# Patient Record
Sex: Female | Born: 1955 | Race: Black or African American | Hispanic: No | Marital: Married | Smoking: Never smoker
Health system: Southern US, Community
[De-identification: ages and names within clinical notes are randomized; demographics above are authoritative.]

## PROBLEM LIST (undated history)

## (undated) DIAGNOSIS — I10 Essential (primary) hypertension: Secondary | ICD-10-CM

## (undated) DIAGNOSIS — R7303 Prediabetes: Secondary | ICD-10-CM

## (undated) DIAGNOSIS — M2550 Pain in unspecified joint: Secondary | ICD-10-CM

## (undated) DIAGNOSIS — M549 Dorsalgia, unspecified: Secondary | ICD-10-CM

## (undated) DIAGNOSIS — E785 Hyperlipidemia, unspecified: Secondary | ICD-10-CM

## (undated) DIAGNOSIS — J45909 Unspecified asthma, uncomplicated: Secondary | ICD-10-CM

## (undated) DIAGNOSIS — M25569 Pain in unspecified knee: Secondary | ICD-10-CM

## (undated) DIAGNOSIS — M25559 Pain in unspecified hip: Secondary | ICD-10-CM

## (undated) DIAGNOSIS — G629 Polyneuropathy, unspecified: Secondary | ICD-10-CM

## (undated) DIAGNOSIS — E119 Type 2 diabetes mellitus without complications: Secondary | ICD-10-CM

## (undated) HISTORY — DX: Essential (primary) hypertension: I10

## (undated) HISTORY — DX: Type 2 diabetes mellitus without complications: E11.9

## (undated) HISTORY — PX: OTHER SURGICAL HISTORY: SHX169

## (undated) HISTORY — DX: Pain in unspecified knee: M25.569

## (undated) HISTORY — DX: Pain in unspecified joint: M25.50

## (undated) HISTORY — DX: Unspecified asthma, uncomplicated: J45.909

## (undated) HISTORY — DX: Polyneuropathy, unspecified: G62.9

## (undated) HISTORY — DX: Pain in unspecified hip: M25.559

## (undated) HISTORY — DX: Dorsalgia, unspecified: M54.9

## (undated) HISTORY — DX: Prediabetes: R73.03

## (undated) HISTORY — DX: Hyperlipidemia, unspecified: E78.5

---

## 1999-03-23 ENCOUNTER — Encounter: Payer: Self-pay | Admitting: Family Medicine

## 1999-03-23 ENCOUNTER — Ambulatory Visit (HOSPITAL_COMMUNITY): Admission: RE | Admit: 1999-03-23 | Discharge: 1999-03-23 | Payer: Self-pay | Admitting: Family Medicine

## 2000-08-22 ENCOUNTER — Other Ambulatory Visit: Admission: RE | Admit: 2000-08-22 | Discharge: 2000-08-22 | Payer: Self-pay | Admitting: Family Medicine

## 2002-07-03 ENCOUNTER — Ambulatory Visit (HOSPITAL_COMMUNITY): Admission: RE | Admit: 2002-07-03 | Discharge: 2002-07-03 | Payer: Self-pay | Admitting: Family Medicine

## 2004-02-09 ENCOUNTER — Other Ambulatory Visit: Admission: RE | Admit: 2004-02-09 | Discharge: 2004-02-09 | Payer: Self-pay | Admitting: Family Medicine

## 2005-11-28 ENCOUNTER — Other Ambulatory Visit: Admission: RE | Admit: 2005-11-28 | Discharge: 2005-11-28 | Payer: Self-pay | Admitting: Family Medicine

## 2010-05-02 ENCOUNTER — Encounter
Admission: RE | Admit: 2010-05-02 | Discharge: 2010-05-02 | Payer: Self-pay | Source: Home / Self Care | Attending: Family Medicine | Admitting: Family Medicine

## 2013-04-17 ENCOUNTER — Ambulatory Visit (INDEPENDENT_AMBULATORY_CARE_PROVIDER_SITE_OTHER): Payer: BC Managed Care – PPO | Admitting: Podiatry

## 2013-04-17 ENCOUNTER — Encounter: Payer: Self-pay | Admitting: Podiatry

## 2013-04-17 VITALS — BP 125/86 | HR 66 | Ht 66.0 in | Wt 245.0 lb

## 2013-04-17 DIAGNOSIS — B351 Tinea unguium: Secondary | ICD-10-CM | POA: Insufficient documentation

## 2013-04-17 DIAGNOSIS — G629 Polyneuropathy, unspecified: Secondary | ICD-10-CM

## 2013-04-17 DIAGNOSIS — G589 Mononeuropathy, unspecified: Secondary | ICD-10-CM

## 2013-04-17 DIAGNOSIS — M79609 Pain in unspecified limb: Secondary | ICD-10-CM

## 2013-04-17 DIAGNOSIS — B353 Tinea pedis: Secondary | ICD-10-CM | POA: Insufficient documentation

## 2013-04-17 DIAGNOSIS — M79673 Pain in unspecified foot: Secondary | ICD-10-CM | POA: Insufficient documentation

## 2013-04-17 MED ORDER — EFINACONAZOLE 10 % EX SOLN: 1.0000 "application " | Freq: Every morning | CUTANEOUS | Status: DC

## 2013-04-17 NOTE — Patient Instructions (Signed)
Seen for diabetic foot care. Nails debrided. Jublia prescribed. Return in 2 months.

## 2013-04-17 NOTE — Progress Notes (Signed)
Numbness on bottom of R>L. Little toe look different, has dead skin.,  Been diabetic less than 5 years and under control.  Review of Systems - General ROS: Having problem with sleeping at night due to stressful situation. Uses OTC products to help out. Denies fever or chills, no weight loss. Ophthalmic ROS: negative ENT ROS: negative Hematological and Lymphatic ROS: negative Breast ROS: Gets done yearly mammogram.  Respiratory ROS: no cough, shortness of breath, or wheezing Cardiovascular ROS: no chest pain or dyspnea on exertion Gastrointestinal ROS: no abdominal pain, change in bowel habits, or black or bloody stools Genito-Urinary ROS: no dysuria, trouble voiding, or hematuria Musculoskeletal ROS: Hips and knee pain with arthritis.  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  Dermatological ROS: Dry scally skin both feet.   Objective: Vascular: All pedal pulses are palpable.  Dermatologic: Thick discolored abnormal nails x 10. Build up callus like tissue at 4th web space left. Dray scaly skin bilateral. Orthopedic: Pes planus with rearfoot STJ pronation. Neurologic: Subjective numbness on bottom of feet. Normal response to Monofilament sensory and vibratory sensory testing bilateral.  Assessment: Onychomycosis x 10. Tinea pedis bilateral. Neurologic symptoms possible from pes planus.  Plan:  Reviewed findings. All nails and skin lesions debrided. Home care instruction given. Jublia prescribed.

## 2013-06-17 ENCOUNTER — Ambulatory Visit: Payer: BC Managed Care – PPO | Admitting: Podiatry

## 2013-07-29 ENCOUNTER — Encounter: Payer: Self-pay | Admitting: Podiatry

## 2013-07-29 ENCOUNTER — Ambulatory Visit (INDEPENDENT_AMBULATORY_CARE_PROVIDER_SITE_OTHER): Payer: BC Managed Care – PPO | Admitting: Podiatry

## 2013-07-29 VITALS — BP 126/90 | HR 63 | Ht 66.0 in | Wt 243.0 lb

## 2013-07-29 DIAGNOSIS — M79606 Pain in leg, unspecified: Secondary | ICD-10-CM

## 2013-07-29 DIAGNOSIS — B351 Tinea unguium: Secondary | ICD-10-CM

## 2013-07-29 DIAGNOSIS — M79609 Pain in unspecified limb: Secondary | ICD-10-CM

## 2013-07-29 NOTE — Patient Instructions (Signed)
Seen for hypertrophic nails. All nails and calluses debrided. Return in 3 months or as needed.  

## 2013-07-29 NOTE — Progress Notes (Signed)
Painful toe nails and neuropathy pain. Went to another office and got treatment x 2 weeks, 3x/wk. No change noted.  Objective: Thick dystrophic nails x 10. Plantar callus ball of left foot.  All pedal pulses are palpable.   Assessment: Onychomycosis x 10.  Plan: All nails and calluses debrided. Return in 3 months or as needed.

## 2013-08-18 ENCOUNTER — Other Ambulatory Visit: Payer: Self-pay | Admitting: Family Medicine

## 2013-08-18 ENCOUNTER — Other Ambulatory Visit (HOSPITAL_COMMUNITY)
Admission: RE | Admit: 2013-08-18 | Discharge: 2013-08-18 | Disposition: A | Discharge status: Home / Self Care | Payer: BC Managed Care – PPO | Source: Ambulatory Visit | Attending: Family Medicine | Admitting: Family Medicine

## 2013-08-18 DIAGNOSIS — Z1151 Encounter for screening for human papillomavirus (HPV): Secondary | ICD-10-CM | POA: Insufficient documentation

## 2013-08-18 DIAGNOSIS — Z01419 Encounter for gynecological examination (general) (routine) without abnormal findings: Secondary | ICD-10-CM | POA: Insufficient documentation

## 2013-10-28 ENCOUNTER — Ambulatory Visit: Payer: BC Managed Care – PPO | Admitting: Podiatry

## 2014-08-18 DIAGNOSIS — G609 Hereditary and idiopathic neuropathy, unspecified: Secondary | ICD-10-CM | POA: Insufficient documentation

## 2014-08-18 DIAGNOSIS — R55 Syncope and collapse: Secondary | ICD-10-CM | POA: Insufficient documentation

## 2014-08-18 DIAGNOSIS — G930 Cerebral cysts: Secondary | ICD-10-CM | POA: Insufficient documentation

## 2014-08-18 DIAGNOSIS — I679 Cerebrovascular disease, unspecified: Secondary | ICD-10-CM | POA: Insufficient documentation

## 2015-03-10 ENCOUNTER — Other Ambulatory Visit: Payer: Self-pay | Admitting: Podiatry

## 2015-03-10 ENCOUNTER — Ambulatory Visit: Payer: BC Managed Care – PPO | Admitting: Podiatry

## 2015-03-10 ENCOUNTER — Encounter: Payer: Self-pay | Admitting: Podiatry

## 2015-03-10 ENCOUNTER — Ambulatory Visit (INDEPENDENT_AMBULATORY_CARE_PROVIDER_SITE_OTHER): Payer: BC Managed Care – PPO

## 2015-03-10 ENCOUNTER — Ambulatory Visit (INDEPENDENT_AMBULATORY_CARE_PROVIDER_SITE_OTHER): Payer: BC Managed Care – PPO | Admitting: Podiatry

## 2015-03-10 VITALS — BP 122/82 | HR 77 | Resp 16 | Ht 66.0 in | Wt 256.0 lb

## 2015-03-10 DIAGNOSIS — M779 Enthesopathy, unspecified: Secondary | ICD-10-CM

## 2015-03-10 DIAGNOSIS — M6740 Ganglion, unspecified site: Secondary | ICD-10-CM | POA: Diagnosis not present

## 2015-03-10 NOTE — Progress Notes (Signed)
   Subjective:    Patient ID: LASHAY MCROY, female    DOB: 07/16/1955, 59 y.o.   MRN: [id number]  HPI Patient presents with ankle pain in their right foot; both sides; swelling. Pt stated, "when walks, feels a cramp top of foot near ankle area"; x2 months.  Pt would also like a medication refill for Jublia.   Review of Systems  Respiratory: Positive for wheezing.   Cardiovascular: Positive for leg swelling.  Musculoskeletal: Positive for myalgias, back pain and arthralgias.  All other systems reviewed and are negative.      Objective:   Physical Exam        Assessment & Plan:

## 2015-03-13 NOTE — Progress Notes (Signed)
Subjective:     Patient ID: [name], female   DOB: Feb 07, 1956, 59 y.o.   MRN: [id number]  HPI patient presents with a lot of pain in the outside of the right ankle for several months with her noting a nodule in the last 2 months that is very painful when she presses it or tries to wear shoe gear   Review of Systems  All other systems reviewed and are negative.      Objective:   Physical Exam  Constitutional: She is oriented to person, place, and time.  Cardiovascular: Intact distal pulses.   Musculoskeletal: Normal range of motion.  Neurological: She is oriented to person, place, and time.  Skin: Skin is warm.  Nursing note and vitals reviewed.  neurovascular status found to be intact muscle strength was adequate with range of motion mildly reduced in the ankle due to pain. On the lateral side I noted a palpable movable mass in the right ankle that appears to have a defined shape and appears to be within subcutaneous tissue. It is painful when palpated at this time     Assessment:     Nodule of the right lateral ankle which could be cystic or possible fibrous in nature    Plan:     H&P and x-rays reviewed. Today I did a proximal nerve block and under sterile conditions I attempted aspiration of the mass and was unable to aspirate any material from the mass. I have advised surgical excision and do feel that most likely it will be benign but I do believe it needs to be biopsied and due to my schedule being completely filled I have referred this patient to Dr. Jacqualyn Posey for evaluation and probable surgical intervention. Explained all this to patient at this time

## 2015-03-18 ENCOUNTER — Encounter: Payer: Self-pay | Admitting: Podiatry

## 2015-03-18 ENCOUNTER — Ambulatory Visit (INDEPENDENT_AMBULATORY_CARE_PROVIDER_SITE_OTHER): Payer: BC Managed Care – PPO | Admitting: Podiatry

## 2015-03-18 DIAGNOSIS — M6740 Ganglion, unspecified site: Secondary | ICD-10-CM | POA: Diagnosis not present

## 2015-03-18 NOTE — Patient Instructions (Signed)

## 2015-03-21 ENCOUNTER — Telehealth: Payer: Self-pay | Admitting: *Deleted

## 2015-03-21 NOTE — Telephone Encounter (Signed)
"  Dr. Jacqualyn Posey has me scheduled for surgery on Wednesday, the 21st.  Doing surgery on my ankle.  He needed my A1c before he would do anything.  I just called my doctor, she did it on Friday.  She said it is 5.7.  If you have any questions, give me a call.  Call me back anytime after 3pm, that would be good."

## 2015-03-21 NOTE — Telephone Encounter (Signed)
Thanks, that is good

## 2015-03-21 NOTE — Progress Notes (Signed)
Patient ID: [name], female   DOB: 13-Dec-1955, 59 y.o.   MRN: [id number]  Subjective: 59 year old female presents the office today for surgical consultation of the soft tissue mass in the outside part of her right ankle. She states that she has had a mass there for several months has become noticeable of the last 2 months. Areas very painful with pressure in certain shoes. Denies any overlying skin change or redness or swelling. She has tried shoe gear changes and offloading padding without any relief of symptoms. At this time she is requesting surgical intervention to help decrease her pain and deformity. She is diabetic and she states her blood sugars typically run between 101 120. She denies any claudication symptoms. She denies any history or family history of bleeding disorders or blood clots or sickle cell.  Objective: AAO 3, NAD DP/PT pulses palpable, CRT less than 3 seconds Protective sensation intact with Simms Weinstein monofilament, vibratory sensation intact, Achilles tendon reflex intact. On the lateral aspect of the right ankle distal distal to the lateral malleolus there is a small, fluid-filled, and capsular soft tissue mass with tenderness palpation along this area. There is no overlying skin changes or erythema. No masses appear to be mobile. No other masses are identified bilateral lower shortness. No other areas of tenderness bilateral lower joints. Ankle, subtalar joint range of motion is intact with any restrictions. MMT 5/5. No open lesions or pre-ulcerative lesions. No pain with calf compression, swelling, warmth, erythema.  Assessment: 59 year old female right soft tissue mass, possible ganglion cyst  Plan: -Treatment options discussed including all alternatives, risks, and complications -X-rays were reviewed with the patient. -At this time discussed both conservative and surgical treatment options. At this time she is unable to proceed with surgical treatment.  Discussed her soft tissue mass excision right lateral ankle. -The incision placement as well as the postoperative course was discussed with the patient. I discussed risks of the surgery which include, but not limited to, infection, bleeding, pain, swelling, need for further surgery, delayed or nonhealing, painful or ugly scar, numbness or sensation changes, over/under correction, recurrence, transfer lesions, reoccurrence, further deformity, hardware failure, DVT/PE, loss of toe/foot. Patient understands these risks and wishes to proceed with surgery. The surgical consent was reviewed with the patient all 3 pages were signed. No promises or guarantees were given to the outcome of the procedure. All questions were answered to the best of my ability. Before the surgery the patient was encouraged to call the office if there is any further questions. The surgery will be performed at the [facility] on an outpatient basis.  Celesta Gentile, DPM

## 2015-03-21 NOTE — Telephone Encounter (Signed)
Pt states her A1C is 5.7.

## 2015-03-21 NOTE — Telephone Encounter (Signed)
Dr. Jacqualyn Posey got your message.  He said that is great.  "I got a sponge and a ice gel pack.  What am I supposed to do with the ice pack?"  That is to be used post operatively, 15 minutes per hour for a couple of days.  "Okay, thanks."

## 2015-03-23 ENCOUNTER — Encounter: Payer: Self-pay | Admitting: *Deleted

## 2015-03-23 DIAGNOSIS — M6740 Ganglion, unspecified site: Secondary | ICD-10-CM | POA: Diagnosis not present

## 2015-03-31 ENCOUNTER — Other Ambulatory Visit: Payer: Self-pay

## 2015-03-31 ENCOUNTER — Telehealth: Payer: Self-pay | Admitting: *Deleted

## 2015-03-31 NOTE — Telephone Encounter (Signed)
CALLED PATIENT ON 03/24/15 AND PATIENT STATED THAT SHE WAS DOING GOOD AND I STATED TO CALL THE OFFICE IF ANY CONCERNS. [name]

## 2015-04-01 ENCOUNTER — Encounter: Payer: Self-pay | Admitting: Podiatry

## 2015-04-01 ENCOUNTER — Ambulatory Visit (INDEPENDENT_AMBULATORY_CARE_PROVIDER_SITE_OTHER): Payer: BC Managed Care – PPO | Admitting: Podiatry

## 2015-04-01 VITALS — Temp 98.7°F

## 2015-04-01 DIAGNOSIS — Z9889 Other specified postprocedural states: Secondary | ICD-10-CM

## 2015-04-01 DIAGNOSIS — M6740 Ganglion, unspecified site: Secondary | ICD-10-CM | POA: Diagnosis not present

## 2015-04-01 NOTE — Patient Instructions (Signed)
Ganglion Cyst  A ganglion cyst is a noncancerous, fluid-filled lump that occurs near joints or tendons. The ganglion cyst grows out of a joint or the lining of a tendon. It most often develops in the hand or wrist, but it can also develop in the shoulder, elbow, hip, knee, ankle, or foot. The round or oval ganglion cyst can be the size of a pea or larger than a grape. Increased activity may enlarge the size of the cyst because more fluid starts to build up.   CAUSES  It is not known what causes a ganglion cyst to grow. However, it may be related to:  · Inflammation or irritation around the joint.  · An injury.  · Repetitive movements or overuse.  · Arthritis.  RISK FACTORS  Risk factors include:  · Being a woman.  · Being age 20-50.  SIGNS AND SYMPTOMS  Symptoms may include:   · A lump. This most often appears on the hand or wrist, but it can occur in other areas of the body.  · Tingling.  · Pain.  · Numbness.  · Muscle weakness.  · Weak grip.  · Less movement in a joint.  DIAGNOSIS  Ganglion cysts are most often diagnosed based on a physical exam. Your health care provider will feel the lump and may shine a light alongside it. If it is a ganglion cyst, a light often shines through it. Your health care provider may order an X-ray, ultrasound, or MRI to rule out other conditions.  TREATMENT  Ganglion cysts usually go away on their own without treatment. If pain or other symptoms are involved, treatment may be needed. Treatment is also needed if the ganglion cyst limits your movement or if it gets infected. Treatment may include:  · Wearing a brace or splint on your wrist or finger.  · Taking anti-inflammatory medicine.  · Draining fluid from the lump with a needle (aspiration).  · Injecting a steroid into the joint.  · Surgery to remove the ganglion cyst.  HOME CARE INSTRUCTIONS  · Do not press on the ganglion cyst, poke it with a needle, or hit it.  · Take medicines only as directed by your health care  provider.  · Wear your brace or splint as directed by your health care provider.  · Watch your ganglion cyst for any changes.  · Keep all follow-up visits as directed by your health care provider. This is important.  SEEK MEDICAL CARE IF:  · Your ganglion cyst becomes larger or more painful.  · You have increased redness, red streaks, or swelling.  · You have pus coming from the lump.  · You have weakness or numbness in the affected area.  · You have a fever or chills.     This information is not intended to replace advice given to you by your health care provider. Make sure you discuss any questions you have with your health care provider.     Document Released: 03/16/2000 Document Revised: 04/09/2014 Document Reviewed: 09/01/2013  Elsevier Interactive Patient Education ©2016 [employer]

## 2015-04-04 NOTE — Progress Notes (Signed)
Patient ID: SARAHANN PINTOR, female   DOB: [birth year], 60 y.o.   MRN: [id number]  Subjective: MAYBETH HOBBY is a 60 y.o. is seen today in office s/p right ankle soft tissue mass excision preformed on 03/23/15. She states that she is going not having any pain. She spends the antibiotics and she's remain in the cam boot. Denies any systemic complaints such as fevers, chills, nausea, vomiting. No calf pain, chest pain, shortness of breath.   Objective: General: No acute distress, AAOx3  DP/PT pulses palpable 2/4, CRT < 3 sec to all digits.  Protective sensation intact. Motor function intact.  Right ankle: Incision is well coapted without any evidence of dehiscence and sutures intact. There is no surrounding erythema, ascending cellulitis, fluctuance, crepitus, malodor, drainage/purulence. There is minimal edema around the surgical site. There is no pain along the surgical site.  No other areas of tenderness to bilateral lower extremities.  No other open lesions or pre-ulcerative lesions.  No pain with calf compression, swelling, warmth, erythema.   Assessment and Plan:  Status post right soft tissue mass excision, doing well with no complications   -Treatment options discussed including all alternatives, risks, and complications -Ice/elevation -Continue cam boot. -Pain medication as needed. -Monitor for any clinical signs or symptoms of infection and DVT/PE and directed to call the office immediately should any occur or go to the ER. -Follow-up 1 week for likely suture removal or sooner if any problems arise. In the meantime, encouraged to call the office with any questions, concerns, change in symptoms.   Celesta Gentile, DPM

## 2015-04-08 ENCOUNTER — Encounter: Payer: Self-pay | Admitting: Podiatry

## 2015-04-08 ENCOUNTER — Ambulatory Visit (INDEPENDENT_AMBULATORY_CARE_PROVIDER_SITE_OTHER): Payer: BC Managed Care – PPO | Admitting: Podiatry

## 2015-04-08 VITALS — BP 109/68 | HR 86 | Resp 12

## 2015-04-08 DIAGNOSIS — G629 Polyneuropathy, unspecified: Secondary | ICD-10-CM

## 2015-04-08 DIAGNOSIS — M6740 Ganglion, unspecified site: Secondary | ICD-10-CM

## 2015-04-08 DIAGNOSIS — Z9889 Other specified postprocedural states: Secondary | ICD-10-CM

## 2015-04-08 MED ORDER — GABAPENTIN 100 MG PO CAPS: 100.0000 mg | ORAL_CAPSULE | Freq: Every day | ORAL | Status: DC

## 2015-04-11 DIAGNOSIS — M6740 Ganglion, unspecified site: Secondary | ICD-10-CM | POA: Insufficient documentation

## 2015-04-11 NOTE — Progress Notes (Signed)
Patient ID: [name], female   DOB: 1955/09/15, 60 y.o.   MRN: [id number]  Subjective: [name] is a 60 y.o. is seen today in office s/p right ankle soft tissue mass excision preformed on 03/23/15. She states that she is doing well and she is not having any pain and is not taking any pain medication. She has remained in the CAM boot. She also states that she previously was on gabapentin for neuropathy which seem to help however she was taken off of this and silver numbness and tingling have reoccurred and she is asking about possible treatments for neuropathy. She had no side effects the gabapentin. Denies any systemic complaints such as fevers, chills, nausea, vomiting. No calf pain, chest pain, shortness of breath.   Objective: General: No acute distress, AAOx3  DP/PT pulses palpable 2/4, CRT < 3 sec to all digits.  Protective sensation intact. Motor function intact. There is subjective Ms. and tingling to bilateral feet. Right ankle: Incision is well coapted without any evidence of dehiscence and sutures intact. There is no surrounding erythema, ascending cellulitis, fluctuance, crepitus, malodor, drainage/purulence. There is faint edema around the surgical site. There is no pain along the surgical site.  No other areas of tenderness to bilateral lower extremities.  No other open lesions or pre-ulcerative lesions.  No pain with calf compression, swelling, warmth, erythema.   Assessment and Plan:  Status post right soft tissue mass excision, doing well with no complications   -Treatment options discussed including all alternatives, risks, and complications -Sutures removed today without complications. She can start to shower tomorrow as long as incision remains closed. Antibiotic ointment and a bandage overlying the area. -Ice/elevation -Can transition to regular she was tolerated. -Given her symptoms the gabapentin previously helped her we'll restart the gabapentin. I discussed  with her how to titrate up the dose will start 100 mg at nighttime. -Monitor for any clinical signs or symptoms of infection and DVT/PE and directed to call the office immediately should any occur or go to the ER. -Follow-up 4 weeks or sooner if any problems arise. In the meantime, encouraged to call the office with any questions, concerns, change in symptoms.   Celesta Gentile, DPM

## 2015-04-26 ENCOUNTER — Telehealth: Payer: Self-pay | Admitting: *Deleted

## 2015-04-26 NOTE — Telephone Encounter (Addendum)
Pt states she's been back at work for about 1 week, has ankle swelling in the surgical foot.  I spoke with pt and she states she's in a regular shoe and up and down at work, and has had swelling in the ankle for one week.  Pt denies any calf pain, redness or drainage from the surgical site, just ankle swelling.  I told pt she may have begun the casual shoe at work a little early, to go to work in the surgical boot, and to wear the casual shoe at home until uncomfortable then go back into the surgical boot until comfortable and then back in to the casual shoe and eventually she will be in the casual more at home then can do more in the casual shoe at work.  Pt states understanding and I will inform Dr. Jacqualyn Posey and call if more information.  04/27/2015 - LEFT MESSAGE informing pt of Dr. Leigh Aurora orders to continue elastic anklet and to come in if swelling continues.

## 2015-04-27 NOTE — Telephone Encounter (Signed)
If continues then she needs to come in. Wear elastic anklet to help with swelling as well.

## 2015-05-06 ENCOUNTER — Ambulatory Visit (INDEPENDENT_AMBULATORY_CARE_PROVIDER_SITE_OTHER): Payer: BC Managed Care – PPO | Admitting: Podiatry

## 2015-05-06 ENCOUNTER — Encounter: Payer: Self-pay | Admitting: Podiatry

## 2015-05-06 VITALS — BP 122/86 | HR 91 | Resp 18

## 2015-05-06 DIAGNOSIS — M6740 Ganglion, unspecified site: Secondary | ICD-10-CM

## 2015-05-06 DIAGNOSIS — R609 Edema, unspecified: Secondary | ICD-10-CM

## 2015-05-06 DIAGNOSIS — Z9889 Other specified postprocedural states: Secondary | ICD-10-CM

## 2015-05-08 NOTE — Progress Notes (Signed)
Patient ID: [name], female   DOB: [birth year], 60 y.o.   MRN: [id number]  Subjective: [name] is a 60 y.o. is seen today in office s/p right ankle soft tissue mass excision preformed on 03/23/15. She states overall she is doing better however she does so gets some swelling around the surgical site and some pain. She denies any erythema or increase in warmth. She is back to wearing her regular shoe. Denies any systemic complaints such as fevers, chills, nausea, vomiting. No calf pain, chest pain, shortness of breath.   Objective: General: No acute distress, AAOx3  DP/PT pulses palpable 2/4, CRT < 3 sec to all digits.  Protective sensation intact. Motor function intact. There is subjective numbness and tingling to bilateral feet. Right ankle: Incision is well coapted without any evidence of dehiscence and a scar has formed. There is no surrounding erythema, ascending cellulitis, fluctuance, crepitus, malodor, drainage/purulence. There is continued mild edema around the surgical site. There is minimal pain along the surgical site.  No other areas of tenderness to bilateral lower extremities.  No other open lesions or pre-ulcerative lesions.  No pain with calf compression, swelling, warmth, erythema.   Assessment and Plan:  Status post right soft tissue mass excision, doing well with no complications   -Treatment options discussed including all alternatives, risks, and complications -Today for Unna boot was applied to help control the residual swelling. Discussed that a cut that wrapping off in 5 days or sooner if there is any problems which are discussed with her. -Continue gabapentin. -Monitor for any clinical signs or symptoms of infection and DVT/PE and directed to call the office immediately should any occur or go to the ER. -Follow-up 4 weeks or sooner if any problems arise. In the meantime, encouraged to call the office with any questions, concerns, change in symptoms.    Celesta Gentile, DPM

## 2015-10-24 NOTE — Progress Notes (Signed)
Surgery, Excision Ganglion / Tumor right foot, performed at [facility]

## 2016-09-26 ENCOUNTER — Other Ambulatory Visit: Payer: Self-pay | Admitting: Family Medicine

## 2016-09-26 ENCOUNTER — Other Ambulatory Visit (HOSPITAL_COMMUNITY)
Admission: RE | Admit: 2016-09-26 | Discharge: 2016-09-26 | Disposition: A | Discharge status: Home / Self Care | Payer: BC Managed Care – PPO | Source: Ambulatory Visit | Attending: Family Medicine | Admitting: Family Medicine

## 2016-09-26 DIAGNOSIS — Z124 Encounter for screening for malignant neoplasm of cervix: Secondary | ICD-10-CM | POA: Diagnosis present

## 2016-09-26 DIAGNOSIS — R87810 Cervical high risk human papillomavirus (HPV) DNA test positive: Secondary | ICD-10-CM | POA: Insufficient documentation

## 2016-09-28 LAB — CYTOLOGY - PAP
ADEQUACY: ABSENT
DIAGNOSIS: NEGATIVE
HPV 16/18/45 genotyping: NEGATIVE
HPV: DETECTED — AB

## 2016-11-21 ENCOUNTER — Other Ambulatory Visit: Payer: Self-pay | Admitting: Obstetrics and Gynecology

## 2018-04-10 ENCOUNTER — Encounter (INDEPENDENT_AMBULATORY_CARE_PROVIDER_SITE_OTHER): Payer: BC Managed Care – PPO

## 2018-04-15 ENCOUNTER — Ambulatory Visit (INDEPENDENT_AMBULATORY_CARE_PROVIDER_SITE_OTHER): Payer: BC Managed Care – PPO | Admitting: Family Medicine

## 2018-04-15 ENCOUNTER — Encounter (INDEPENDENT_AMBULATORY_CARE_PROVIDER_SITE_OTHER): Payer: Self-pay | Admitting: Family Medicine

## 2018-04-15 VITALS — BP 103/68 | HR 64 | Ht 66.0 in | Wt 264.0 lb

## 2018-04-15 DIAGNOSIS — E7849 Other hyperlipidemia: Secondary | ICD-10-CM

## 2018-04-15 DIAGNOSIS — I10 Essential (primary) hypertension: Secondary | ICD-10-CM

## 2018-04-15 DIAGNOSIS — R5383 Other fatigue: Secondary | ICD-10-CM | POA: Diagnosis not present

## 2018-04-15 DIAGNOSIS — R739 Hyperglycemia, unspecified: Secondary | ICD-10-CM

## 2018-04-15 DIAGNOSIS — Z9189 Other specified personal risk factors, not elsewhere classified: Secondary | ICD-10-CM | POA: Diagnosis not present

## 2018-04-15 DIAGNOSIS — R0602 Shortness of breath: Secondary | ICD-10-CM

## 2018-04-15 DIAGNOSIS — Z1331 Encounter for screening for depression: Secondary | ICD-10-CM | POA: Diagnosis not present

## 2018-04-15 DIAGNOSIS — Z0289 Encounter for other administrative examinations: Secondary | ICD-10-CM

## 2018-04-15 DIAGNOSIS — Z6841 Body Mass Index (BMI) 40.0 and over, adult: Secondary | ICD-10-CM

## 2018-04-15 NOTE — Progress Notes (Signed)
Office: [phone]  /  Fax: [phone]   Dear Dr. Servando [name],   Thank you for referring [name] [name] to our clinic. The following note includes my evaluation and treatment recommendations.  HPI:   Chief Complaint: OBESITY    [name] [name] has been referred by Dr. Servando [name] for consultation regarding her obesity and obesity related comorbidities.    [name] [name] (MR# [id number]) is a 63 y.o. female who presents on 04/15/2018 for obesity evaluation and treatment. Current BMI is Body mass index is 42.61 kg/m.  [name] [name] has been struggling with her weight for many years and has been unsuccessful in either losing weight, maintaining weight loss, or reaching her healthy weight goal.     [name] [name] attended our information session and states she is currently in the action stage of change and ready to dedicate time achieving and maintaining a healthier weight. [name] [name] is interested in becoming our [name] [name] and working on intensive lifestyle modifications including (but not limited to) diet, exercise and weight loss.    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 she snacks frequently in the evenings she skips lunch sometimes she is frequently drinking liquids with calories she frequently makes poor food choices she has binge eating behaviors she struggles with emotional eating    [name] [name] feels her energy is lower than it should be. This has worsened with weight gain and has not worsened recently. [name] [name] admits to daytime somnolence and admits to waking up still tired. [name] [name] is at risk for obstructive sleep apnea. Patent has a history of symptoms of daytime [name] and hypertension. [name] [name] generally gets 5 or 6 hours of sleep per night, and states they generally have restless sleep. Snoring is  present. Apneic episodes are present. Epworth Sleepiness Score is 4.  Dyspnea on exertion [name] [name] notes increasing shortness of breath with exercising and seems to be worsening over time with weight gain. She notes getting out of breath sooner with activity than she used to. This has not gotten worse recently. [name] [name] denies orthopnea.  Hyperlipidemia [name] [name] has hyperlipidemia and has been trying to improve her cholesterol levels with intensive lifestyle modification including a low saturated fat diet, exercise and weight loss. She is on simvastatin and denies any chest pain or myalgias.She would like to improve with diet control.  Hypertension [name] [name] is a 63 y.o. female with hypertension. [name] [name]'s blood pressure is currently stable and she is on [name] [name] 10/40 and spironolactone 25mg . She is working on weight loss to help control her blood pressure with the goal of decreasing her risk of heart attack and stroke. [name] denies chest pain, headache, or lightheadedness..  Hyperglycemia [name] [name] has been on metformin in the past and denies a family history of diabetes.   At risk for diabetes [name] [name] is at higher than average risk for developing diabetes due to her hyperglycemia and obesity. She currently denies polyuria or polydipsia.  Depression Screen [name] [name] Food and Mood (modified PHQ-9) score was 5. Depression screen [name] [name] 2/9 04/15/2018  Decreased Interest 0  Down, Depressed, Hopeless 1  PHQ - 2 Score 1  Altered sleeping 1  Tired, decreased energy 1  Change in appetite 1  Feeling bad or failure about yourself  0  Trouble concentrating 0  Moving slowly or fidgety/restless 1  Suicidal thoughts 0  PHQ-9  Score 5  Difficult doing work/chores Not difficult at all    ASSESSMENT AND PLAN:  Other [name] - Plan: EKG 12-Lead, Vitamin B12, CBC With Differential, Folate, T3, TSH, T4, free, VITAMIN D 25 Hydroxy (Vit-D Deficiency, Fractures)  Shortness of breath on exertion  Other  hyperlipidemia - Plan: Lipid Panel With LDL/HDL Ratio  Essential hypertension  Hyperglycemia - Plan: Comprehensive metabolic panel, Hemoglobin A1c, Insulin, random  Depression screening  At risk for diabetes mellitus  Class 3 severe obesity with serious comorbidity and body mass index (BMI) of 40.0 to 44.9 in adult, unspecified obesity type ([name] [name])  PLAN:  [name] [name] was informed that her [name] may be related to obesity, depression or many other causes. Labs will be ordered, and in the meanwhile [name] has agreed to work on diet, exercise and weight loss to help with [name]. Proper sleep hygiene was discussed including the need for 7-8 hours of quality sleep each night. A sleep study was not ordered based on symptoms and Epworth score. An EKG and an indirect calorimetry was ordered today. [name] agrees to follow up in 2 weeks.  Dyspnea on exertion [name]'s shortness of breath appears to be obesity related and exercise induced. She has agreed to work on weight loss and gradually increase exercise to treat her exercise induced shortness of breath. If [name] follows our instructions and loses weight without improvement of her shortness of breath, we will plan to refer to pulmonology. We will monitor this condition regularly. We ordered labs, an indirect calorimetry, and an EKG today and we will see her in 2 weeks. [name] agrees to this plan.  Hyperlipidemia [name] was informed of the American Heart Association Guidelines emphasizing intensive lifestyle modifications as the first line treatment for hyperlipidemia. We discussed many lifestyle modifications today in depth, and [name] will continue to work on decreasing saturated fats such as fatty red meat, butter and many fried foods. She will also increase vegetables and lean protein in her diet and continue to work on exercise and weight loss efforts. [name] agrees to continue her medications and to start her diet plan. She will  follow up at the agreed upon time.  Hypertension We discussed sodium restriction, working on healthy weight loss, and a regular exercise program as the means to achieve improved blood pressure control. We will continue to monitor her blood pressure as well as her progress with the above lifestyle modifications. She will continue her medications as prescribed and will watch for signs of hypotension as she continues her lifestyle modifications. We will order labs today and she will start her diet plan. Krystn agreed with this plan and agreed to follow up as directed.  Hyperglycemia Fasting labs will be obtained and results with be discussed with Kearstyn in 2 weeks at her follow up visit. In the meanwhile Jaclynne was started on a lower simple carbohydrate diet and will work on weight loss efforts. Labs were ordered today and she will be starting her new diet prescription. She agrees to follow up in 2 weeks.  Diabetes risk counseling Matalyn was given extended (15 minutes) diabetes prevention counseling today. She is 63 y.o. female and has risk factors for diabetes including hyperglycemia and obesity. We discussed intensive lifestyle modifications today with an emphasis on weight loss as well as increasing exercise and decreasing simple carbohydrates in her diet.  Depression Screen [name] [name] had a mildly positive depression screening. Depression is commonly associated with obesity and often results in emotional eating behaviors. We will monitor this  closely and work on CBT to help improve the non-hunger eating patterns. Referral to Psychology may be required if no improvement is seen as she continues in our clinic.  Obesity [name] [name] is currently in the action stage of change and her goal is to continue with weight loss efforts. I recommend [name] [name] begin the structured treatment plan as follows:  She has agreed to follow the Category 3 plan. [name] [name] has been instructed to eventually work up to a goal of  150 minutes of combined cardio and strengthening exercise per week for weight loss and overall health benefits. We discussed the following Behavioral Modification Strategies today: increasing lean protein intake, decreasing simple carbohydrates, and work on meal planning and easy cooking plans   She was informed of the importance of frequent follow up visits to maximize her success with intensive lifestyle modifications for her multiple health conditions. She was informed we would discuss her lab results at her next visit unless there is a critical issue that needs to be addressed sooner. Janecia agreed to keep her next visit at the agreed upon time to discuss these results.  ALLERGIES: No Known Allergies  MEDICATIONS: Current Outpatient Medications on File Prior to Visit  Medication Sig Dispense Refill  . acetaminophen (TYLENOL) 500 MG tablet Take 500 mg by mouth every 6 (six) hours as needed.    [name] amLODipine-olmesartan ([name] [name]) 10-40 MG per tablet Take 1 tablet by mouth daily.    . naproxen (NAPROSYN) 500 MG tablet Take 500 mg by mouth as needed.     . simvastatin (ZOCOR) 40 MG tablet Take 40 mg by mouth daily.    [name] spironolactone (ALDACTONE) 25 MG tablet Take 25 mg by mouth.    . zaleplon (SONATA) 5 MG capsule Take 5 mg by mouth at bedtime as needed for sleep.     No current facility-administered medications on file prior to visit.     PAST MEDICAL HISTORY: Past Medical History:  Diagnosis Date  . Asthma   . Back pain   . Diabetes ([place])   . Hip pain   . Hyperlipidemia   . Hypertension   . Joint pain   . Knee pain   . Neuropathy   . Prediabetes     PAST SURGICAL HISTORY: Past Surgical History:  Procedure Laterality Date  . cyst removal     Right ankle 2016  . fibroid removal     2001-2003    SOCIAL HISTORY: Social History   Tobacco Use  . Smoking status: Never Smoker  . Smokeless tobacco: Never Used  Substance Use Topics  . Alcohol use: Not on file  . Drug use:  Not on file    FAMILY HISTORY: Family History  Problem Relation Age of Onset  . Diabetes Mother   . Hyperlipidemia Mother   . Hypertension Mother   . Sleep apnea Mother   . Obesity Mother   . Heart disease Father     ROS: Review of Systems  Constitutional: Positive for malaise/[name]. Negative for weight loss.  HENT:       Positive for stuffiness  Eyes:       Wears glasses or contacts.  Respiratory: Positive for shortness of breath and wheezing.   Cardiovascular: Negative for chest pain and orthopnea.       Positive for for calf, knee, and leg pain with walking.  Genitourinary:       Negative for polyuria.  Musculoskeletal: Positive for back pain and joint pain. Negative for myalgias.  Positive for neck stiffness. Positive for muscle pain.  Skin:       Positive for dryness. Positive for tags or moles.  Neurological: Negative for headaches.       Negative for lightheadedness.  Endo/Heme/Allergies: Negative for polydipsia.       Positive for hyperglycemia.    PHYSICAL EXAM: Blood pressure 103/68, pulse 64, height 5\' 6"  (1.676 m), weight 264 lb (119.7 kg), SpO2 99 %. Body mass index is 42.61 kg/m. Physical Exam Vitals signs reviewed.  Constitutional:      Appearance: Normal appearance. She is obese.  HENT:     Head: Normocephalic and atraumatic.     Nose: Nose normal.  Eyes:     General: No scleral icterus.    Extraocular Movements: Extraocular movements intact.  Neck:     Musculoskeletal: Normal range of motion and neck supple.     Comments: Negative for thyromegaly. Cardiovascular:     Rate and Rhythm: Normal rate and regular rhythm.  Pulmonary:     Effort: Pulmonary effort is normal. No respiratory distress.  Abdominal:     Palpations: Abdomen is soft.     Tenderness: There is no abdominal tenderness.     Comments: Positive for obesity.  Musculoskeletal:     Comments: ROM normal in all extremities.  Skin:    General: Skin is warm and dry.    Neurological:     Mental Status: She is alert and oriented to person, place, and time.     Coordination: Coordination normal.  Psychiatric:        Mood and Affect: Mood normal.        Behavior: Behavior normal.     RECENT LABS AND TESTS: BMET No results found for: NA, K, CL, CO2, GLUCOSE, BUN, CREATININE, CALCIUM, GFRNONAA, GFRAA No results found for: HGBA1C No results found for: INSULIN CBC No results found for: WBC, RBC, HGB, HCT, PLT, MCV, MCH, MCHC, RDW, LYMPHSABS, MONOABS, EOSABS, BASOSABS Iron/TIBC/Ferritin/ %Sat No results found for: IRON, TIBC, FERRITIN, IRONPCTSAT Lipid Panel  No results found for: CHOL, TRIG, HDL, CHOLHDL, VLDL, LDLCALC, LDLDIRECT Hepatic Function Panel  No results found for: PROT, ALBUMIN, AST, ALT, ALKPHOS, BILITOT, BILIDIR, IBILI No results found for: TSH  ECG  shows NSR with a rate of 61 BPM. INDIRECT CALORIMETER done today shows a VO2 of 283 and a REE of 1974.  Her calculated basal metabolic rate is 2202 thus her basal metabolic rate is better than expected.  OBESITY BEHAVIORAL INTERVENTION VISIT  Today's visit was # 1   Starting weight: 264 lbs Starting date: 04/15/2018 Today's weight : Weight: 264 lb (119.7 kg)  Today's date: 04/15/2018 Total lbs lost to date: 0  ASK: We discussed the diagnosis of obesity with Luan Moore today and Yanci Bachtell Griffins agreed to give [name] permission to discuss obesity behavioral modification therapy today.  ASSESS: Saphronia has the diagnosis of obesity and her BMI today is 42.6. Kathreen is in the action stage of change.   ADVISE: Zilphia was educated on the multiple health risks of obesity as well as the benefit of weight loss to improve her health. She was advised of the need for [name] term treatment and the importance of lifestyle modifications to improve her current health and to decrease her risk of future health problems.  AGREE: Multiple dietary modification options and treatment options were discussed and  Shaunika agreed to follow the recommendations documented in the above note.  ARRANGE: Nary was educated on the importance of frequent visits  to treat obesity as outlined per CMS and USPSTF guidelines and agreed to schedule her next follow up appointment today.  I, Marcille Blanco, am acting as transcriptionist for Starlyn Skeans, MD  I have reviewed the above documentation for accuracy and completeness, and I agree with the above. -Dennard Nip, MD

## 2018-04-18 LAB — CBC WITH DIFFERENTIAL
BASOS ABS: 0 10*3/uL (ref 0.0–0.2)
Basos: 1 %
EOS (ABSOLUTE): 0.1 10*3/uL (ref 0.0–0.4)
Eos: 2 %
HEMOGLOBIN: 13.9 g/dL (ref 11.1–15.9)
Hematocrit: 41.6 % (ref 34.0–46.6)
IMMATURE GRANULOCYTES: 0 %
Immature Grans (Abs): 0 10*3/uL (ref 0.0–0.1)
LYMPHS: 45 %
Lymphocytes Absolute: 1.7 10*3/uL (ref 0.7–3.1)
MCH: 28.5 pg (ref 26.6–33.0)
MCHC: 33.4 g/dL (ref 31.5–35.7)
MCV: 85 fL (ref 79–97)
Monocytes Absolute: 0.4 10*3/uL (ref 0.1–0.9)
Monocytes: 11 %
NEUTROS PCT: 41 %
Neutrophils Absolute: 1.5 10*3/uL (ref 1.4–7.0)
RBC: 4.88 x10E6/uL (ref 3.77–5.28)
RDW: 12.3 % (ref 11.7–15.4)
WBC: 3.6 10*3/uL (ref 3.4–10.8)

## 2018-04-18 LAB — LIPID PANEL WITH LDL/HDL RATIO
CHOLESTEROL TOTAL: 164 mg/dL (ref 100–199)
HDL: 49 mg/dL (ref >39)
LDL Calculated: 93 mg/dL (ref 0–99)
LDl/HDL Ratio: 1.9 ratio (ref 0.0–3.2)
Triglycerides: 109 mg/dL (ref 0–149)
VLDL CHOLESTEROL CAL: 22 mg/dL (ref 5–40)

## 2018-04-18 LAB — COMPREHENSIVE METABOLIC PANEL
ALBUMIN: 4.2 g/dL (ref 3.6–4.8)
ALT: 12 IU/L (ref 0–32)
AST: 22 IU/L (ref 0–40)
Albumin/Globulin Ratio: 1.6 (ref 1.2–2.2)
Alkaline Phosphatase: 63 IU/L (ref 39–117)
BILIRUBIN TOTAL: 0.4 mg/dL (ref 0.0–1.2)
BUN / CREAT RATIO: 15 (ref 12–28)
BUN: 11 mg/dL (ref 8–27)
CHLORIDE: 103 mmol/L (ref 96–106)
CO2: 18 mmol/L — AB (ref 20–29)
CREATININE: 0.72 mg/dL (ref 0.57–1.00)
Calcium: 9.1 mg/dL (ref 8.7–10.3)
GFR calc Af Amer: 105 mL/min/{1.73_m2} (ref >59)
GFR calc non Af Amer: 91 mL/min/{1.73_m2} (ref >59)
GLUCOSE: 85 mg/dL (ref 65–99)
Globulin, Total: 2.7 g/dL (ref 1.5–4.5)
Potassium: 4.6 mmol/L (ref 3.5–5.2)
Sodium: 140 mmol/L (ref 134–144)
Total Protein: 6.9 g/dL (ref 6.0–8.5)

## 2018-04-18 LAB — T4, FREE: Free T4: 1.11 ng/dL (ref 0.82–1.77)

## 2018-04-18 LAB — HEMOGLOBIN A1C
ESTIMATED AVERAGE GLUCOSE: 117 mg/dL
Hgb A1c MFr Bld: 5.7 % — ABNORMAL HIGH (ref 4.8–5.6)

## 2018-04-18 LAB — VITAMIN D 25 HYDROXY (VIT D DEFICIENCY, FRACTURES): Vit D, 25-Hydroxy: 11 ng/mL — ABNORMAL LOW (ref 30.0–100.0)

## 2018-04-18 LAB — FOLATE: FOLATE: 14.1 ng/mL (ref >3.0)

## 2018-04-18 LAB — T3: T3, Total: 117 ng/dL (ref 71–180)

## 2018-04-18 LAB — TSH: TSH: 1.38 u[IU]/mL (ref 0.450–4.500)

## 2018-04-18 LAB — INSULIN, RANDOM: INSULIN: 5.6 u[IU]/mL (ref 2.6–24.9)

## 2018-04-18 LAB — VITAMIN B12: VITAMIN B 12: 897 pg/mL (ref 232–1245)

## 2018-04-29 ENCOUNTER — Ambulatory Visit (INDEPENDENT_AMBULATORY_CARE_PROVIDER_SITE_OTHER): Payer: BC Managed Care – PPO | Admitting: Family Medicine

## 2018-04-29 ENCOUNTER — Encounter (INDEPENDENT_AMBULATORY_CARE_PROVIDER_SITE_OTHER): Payer: Self-pay | Admitting: Family Medicine

## 2018-04-29 VITALS — BP 109/73 | HR 74 | Temp 98.3°F | Ht 66.0 in | Wt 263.0 lb

## 2018-04-29 DIAGNOSIS — R7303 Prediabetes: Secondary | ICD-10-CM | POA: Diagnosis not present

## 2018-04-29 DIAGNOSIS — E559 Vitamin D deficiency, unspecified: Secondary | ICD-10-CM | POA: Diagnosis not present

## 2018-04-29 DIAGNOSIS — Z6841 Body Mass Index (BMI) 40.0 and over, adult: Secondary | ICD-10-CM

## 2018-04-29 DIAGNOSIS — Z9189 Other specified personal risk factors, not elsewhere classified: Secondary | ICD-10-CM

## 2018-04-29 MED ORDER — METFORMIN HCL 500 MG PO TABS: 500.0000 mg | ORAL_TABLET | Freq: Every day | ORAL | 0 refills | Status: DC

## 2018-04-29 MED ORDER — VITAMIN D (ERGOCALCIFEROL) 1.25 MG (50000 UNIT) PO CAPS: 50000.0000 [IU] | ORAL_CAPSULE | ORAL | 0 refills | Status: DC

## 2018-04-29 MED ORDER — GLUCOSE BLOOD VI STRP: ORAL_STRIP | 0 refills | Status: AC

## 2018-05-01 NOTE — Progress Notes (Signed)
Office: [phone]  /  Fax: [phone]   HPI:   Chief Complaint: OBESITY [name] is here to discuss her progress with her obesity treatment plan. She is on the Category 3 plan and is following her eating plan approximately 50 % of the time. She states she is walking and biking 30 minutes 2 times per week. [name] struggled to stay on track with her eating plan and she noted increased hunger. She didn't do well with meal planning and prepping.  Her weight is 263 lb (119.3 kg) today and has had a weight loss of 1 pound over a period of 2 weeks since her last visit. She has lost 1 lb since starting treatment with [name].  Vitamin D deficiency Mikaylee has a new diagnosis of vitamin D deficiency. She is not currently taking vit D and she admits fatigue.  Pre-Diabetes [name] has a diagnosis of pre-diabetes based on her elevated Hgb A1c and was informed this puts her at greater risk of developing diabetes. She has a glucometer and occasionally checks her blood sugars when she feels her blood sugar is low, but she is out of strips. She is not taking metformin currently and continues to work on diet and exercise to decrease risk of diabetes. She admits polyphagia and hypoglycemia.  At risk for diabetes [name] is at higher than average risk for developing diabetes due to her pre-diabetes and obesity. She currently denies polyuria or polydipsia.  ASSESSMENT AND PLAN:  Vitamin D deficiency - Plan: Vitamin D, Ergocalciferol, (DRISDOL) 1.25 MG (50000 UT) CAPS capsule  Prediabetes - Plan: metFORMIN (GLUCOPHAGE) 500 MG tablet, glucose blood test strip  At risk for diabetes mellitus  Class 3 severe obesity with serious comorbidity and body mass index (BMI) of 40.0 to 44.9 in adult, unspecified obesity type ([place])  PLAN:  Vitamin D Deficiency [name] was informed that low vitamin D levels contributes to fatigue and are associated with obesity, breast, and colon cancer. She agrees to continue to  take prescription Vit D @50 ,000 IU every week #4 with no refills and will follow up for routine testing of vitamin D, at least 2-3 times per year. She was informed of the risk of over-replacement of vitamin D and agrees to not increase her dose unless she discusses this with [name] first. [name] agrees to follow up in 2 weeks.  Pre-Diabetes [name] will continue to work on weight loss, exercise, and decreasing simple carbohydrates in her diet to help decrease the risk of diabetes. We discussed metformin including benefits and risks. She was informed that eating too many simple carbohydrates or too many calories at one sitting increases the likelihood of GI side effects. [name] agreed to start metformin 500mg  qAM #30 with no refills and we refilled OneTouch test strips #100 with no refills and a prescription was written today. [name] agreed to get back to her diet prescription strictly and follow up with [name] as directed to monitor her progress in  2 weeks.  Diabetes risk counseling [name] was given extended (30 minutes) diabetes prevention counseling today. She is 63 y.o. female and has risk factors for diabetes including pre-diabetes and obesity. We discussed intensive lifestyle modifications today with an emphasis on weight loss as well as increasing exercise and decreasing simple carbohydrates in her diet.  Obesity [name] is currently in the action stage of change. As such, her goal is to continue with weight loss efforts. She has agreed to follow the Category 3 plan. [name] has been instructed to work up  to a goal of 150 minutes of combined cardio and strengthening exercise per week for weight loss and overall health benefits. We discussed the following Behavioral Modification Strategies today: increasing lean protein intake, decreasing simple carbohydrates, and work on meal planning and easy cooking plans.  [name] has agreed to follow up with our clinic in 2 weeks. She was informed of the  importance of frequent follow up visits to maximize her success with intensive lifestyle modifications for her multiple health conditions.  ALLERGIES: No Known Allergies  MEDICATIONS: Current Outpatient Medications on File Prior to Visit  Medication Sig Dispense Refill  . acetaminophen (TYLENOL) 500 MG tablet Take 500 mg by mouth every 6 (six) hours as needed.    [name] amLODipine-olmesartan (AZOR) 10-40 MG per tablet Take 1 tablet by mouth daily.    . naproxen (NAPROSYN) 500 MG tablet Take 500 mg by mouth as needed.     . simvastatin (ZOCOR) 40 MG tablet Take 40 mg by mouth daily.    [name] spironolactone (ALDACTONE) 25 MG tablet Take 25 mg by mouth.     No current facility-administered medications on file prior to visit.     PAST MEDICAL HISTORY: Past Medical History:  Diagnosis Date  . Asthma   . Back pain   . Diabetes ([place])   . Hip pain   . Hyperlipidemia   . Hypertension   . Joint pain   . Knee pain   . Neuropathy   . Prediabetes     PAST SURGICAL HISTORY: Past Surgical History:  Procedure Laterality Date  . cyst removal     Right ankle 2016  . fibroid removal     2001-2003    SOCIAL HISTORY: Social History   Tobacco Use  . Smoking status: Never Smoker  . Smokeless tobacco: Never Used  Substance Use Topics  . Alcohol use: Not on file  . Drug use: Not on file    FAMILY HISTORY: Family History  Problem Relation Age of Onset  . Diabetes Mother   . Hyperlipidemia Mother   . Hypertension Mother   . Sleep apnea Mother   . Obesity Mother   . Heart disease Father     ROS: Review of Systems  Constitutional: Positive for malaise/fatigue and weight loss.  Genitourinary:       Negative for polyuria.  Endo/Heme/Allergies: Negative for polydipsia.       Positive for hypoglycemia. Positive for polyphagia.   PHYSICAL EXAM: Blood pressure 109/73, pulse 74, temperature 98.3 F (36.8 C), temperature source Oral, height 5\' 6"  (1.676 m), weight 263 lb (119.3 kg), SpO2  98 %. Body mass index is 42.45 kg/m. Physical Exam Vitals signs reviewed.  Constitutional:      Appearance: Normal appearance. She is obese.  Cardiovascular:     Rate and Rhythm: Normal rate.  Pulmonary:     Effort: Pulmonary effort is normal.  Musculoskeletal: Normal range of motion.  Skin:    General: Skin is warm and dry.  Neurological:     Mental Status: She is alert and oriented to person, place, and time.  Psychiatric:        Mood and Affect: Mood normal.        Behavior: Behavior normal.     RECENT LABS AND TESTS: BMET    Component Value Date/Time   NA 140 04/15/2018 0944   K 4.6 04/15/2018 0944   CL 103 04/15/2018 0944   CO2 18 (L) 04/15/2018 0944   GLUCOSE 85 04/15/2018 0944  BUN 11 04/15/2018 0944   CREATININE 0.72 04/15/2018 0944   CALCIUM 9.1 04/15/2018 0944   GFRNONAA 91 04/15/2018 0944   GFRAA 105 04/15/2018 0944   Lab Results  Component Value Date   HGBA1C 5.7 (H) 04/15/2018   Lab Results  Component Value Date   INSULIN 5.6 04/15/2018   CBC    Component Value Date/Time   WBC 3.6 04/15/2018 0944   RBC 4.88 04/15/2018 0944   HGB 13.9 04/15/2018 0944   HCT 41.6 04/15/2018 0944   MCV 85 04/15/2018 0944   MCH 28.5 04/15/2018 0944   MCHC 33.4 04/15/2018 0944   RDW 12.3 04/15/2018 0944   LYMPHSABS 1.7 04/15/2018 0944   EOSABS 0.1 04/15/2018 0944   BASOSABS 0.0 04/15/2018 0944   Iron/TIBC/Ferritin/ %Sat No results found for: IRON, TIBC, FERRITIN, IRONPCTSAT Lipid Panel     Component Value Date/Time   CHOL 164 04/15/2018 0944   TRIG 109 04/15/2018 0944   HDL 49 04/15/2018 0944   LDLCALC 93 04/15/2018 0944   Hepatic Function Panel     Component Value Date/Time   PROT 6.9 04/15/2018 0944   ALBUMIN 4.2 04/15/2018 0944   AST 22 04/15/2018 0944   ALT 12 04/15/2018 0944   ALKPHOS 63 04/15/2018 0944   BILITOT 0.4 04/15/2018 0944      Component Value Date/Time   TSH 1.380 04/15/2018 0944   Results for Gibbins, BRYNJA MARKER (MRN [id number])  as of 05/01/2018 10:43  Ref. Range 04/15/2018 09:44  Vitamin D, 25-Hydroxy Latest Ref Range: 30.0 - 100.0 ng/mL 11.0 (L)   OBESITY BEHAVIORAL INTERVENTION VISIT  Today's visit was # 2   Starting weight: 264 lbs Starting date: 04/15/18 Today's weight : Weight: 263 lb (119.3 kg)  Today's date: 04/29/2018 Total lbs lost to date: 1  ASK: We discussed the diagnosis of obesity with Luan Moore today and Caren Griffins agreed to give [name] permission to discuss obesity behavioral modification therapy today.  ASSESS: Jamielynn has the diagnosis of obesity and her BMI today is 42.4. Alfredo is in the action stage of change.   ADVISE: Lajean was educated on the multiple health risks of obesity as well as the benefit of weight loss to improve her health. She was advised of the need for long term treatment and the importance of lifestyle modifications to improve her current health and to decrease her risk of future health problems.  AGREE: Multiple dietary modification options and treatment options were discussed and Alta agreed to follow the recommendations documented in the above note.  ARRANGE: Elin was educated on the importance of frequent visits to treat obesity as outlined per CMS and USPSTF guidelines and agreed to schedule her next follow up appointment today.  IMarcille Blanco, CMA, am acting as transcriptionist for Starlyn Skeans, MD  I have reviewed the above documentation for accuracy and completeness, and I agree with the above. -Dennard Nip, MD

## 2018-05-13 ENCOUNTER — Encounter (INDEPENDENT_AMBULATORY_CARE_PROVIDER_SITE_OTHER): Payer: Self-pay | Admitting: Family Medicine

## 2018-05-13 ENCOUNTER — Ambulatory Visit (INDEPENDENT_AMBULATORY_CARE_PROVIDER_SITE_OTHER): Payer: BC Managed Care – PPO | Admitting: Family Medicine

## 2018-05-13 VITALS — BP 114/73 | HR 79 | Temp 98.0°F | Ht 66.0 in | Wt 263.0 lb

## 2018-05-13 DIAGNOSIS — E559 Vitamin D deficiency, unspecified: Secondary | ICD-10-CM | POA: Diagnosis not present

## 2018-05-13 DIAGNOSIS — R7303 Prediabetes: Secondary | ICD-10-CM | POA: Diagnosis not present

## 2018-05-13 DIAGNOSIS — Z6841 Body Mass Index (BMI) 40.0 and over, adult: Secondary | ICD-10-CM

## 2018-05-13 DIAGNOSIS — E66813 Obesity, class 3: Secondary | ICD-10-CM

## 2018-05-14 ENCOUNTER — Encounter (INDEPENDENT_AMBULATORY_CARE_PROVIDER_SITE_OTHER): Payer: Self-pay | Admitting: Family Medicine

## 2018-05-14 DIAGNOSIS — E559 Vitamin D deficiency, unspecified: Secondary | ICD-10-CM | POA: Insufficient documentation

## 2018-05-14 DIAGNOSIS — R7303 Prediabetes: Secondary | ICD-10-CM | POA: Insufficient documentation

## 2018-05-14 DIAGNOSIS — Z6841 Body Mass Index (BMI) 40.0 and over, adult: Secondary | ICD-10-CM

## 2018-05-14 NOTE — Progress Notes (Signed)
Office: [phone]  /  Fax: [phone]   HPI:   Chief Complaint: OBESITY [name] is here to discuss her progress with her obesity treatment plan. She is on the Category 3 plan and is following her eating plan approximately 95 % of the time. She states she is walking and biking 30 minutes 2 times per week. Filippa is not eating all of her snack calories. She reports fatigue. Her weight is 263 lb (119.3 kg) today and has had a weight loss of 0 lbs pounds over a period of 2 weeks since her last visit. She has lost 1 lbs since starting treatment with [name].  Pre-Diabetes [name] has a diagnosis of prediabetes based on her elevated Hgb A1c and was informed this puts her at greater risk of developing diabetes. Her last A1C was 5.7 on 04/15/2018.  She is not taking metformin currently and continues to work on diet and exercise to decrease risk of diabetes. She denies diarrhea. She reports polyphagia after dinner.  Vitamin D deficiency [name] has a diagnosis of vitamin D deficiency. She is currently taking prescription Vit D and denies nausea, vomiting or muscle weakness. Her last Vit level was 11.0 on 04/15/2018. She reports fatigue.  ASSESSMENT AND PLAN:  Prediabetes  Vitamin D deficiency  Class 3 severe obesity with serious comorbidity and body mass index (BMI) of 45.0 to 49.9 in adult, unspecified obesity type [facility])  PLAN:  Pre-Diabetes [name] will continue to work on weight loss, exercise, and decreasing simple carbohydrates in her diet to help decrease the risk of diabetes. [name] agrees to continue with her meal plan and follow up with our clinic in 2 weeks.  Vitamin D Deficiency  [name] was informed that low vitamin D levels contributes to fatigue and are associated with obesity, breast, and colon cancer. She agrees to continue to take prescription Vit D 50,000 IU every week and will follow up for routine testing of vitamin D, at least 2-3 times per year. She was informed of the  risk of over-replacement of vitamin D and agrees to not increase her dose unless she discusses this with [name] first. [name] agrees to follow up with our clinic in 2 weeks.  I spent > than 50% of the 15 minute visit on counseling as documented in the note.  Obesity [name] is currently in the action stage of change. As such, her goal is to continue with weight loss efforts She has agreed to follow the Category 3 plan [name] has been instructed to continue walking and biking 30 minutes 3 times a week. 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 Exchanges for protein were discussed. She was encouraged to eat all of her snack calories.  [name] has agreed to follow up with our clinic in 2 weeks. She was informed of the importance of frequent follow up visits to maximize her success with intensive lifestyle modifications for her multiple health conditions.  ALLERGIES: No Known Allergies  MEDICATIONS: Current Outpatient Medications on File Prior to Visit  Medication Sig Dispense Refill  . acetaminophen (TYLENOL) 500 MG tablet Take 500 mg by mouth every 6 (six) hours as needed.    [name] amLODipine-olmesartan (AZOR) 10-40 MG per tablet Take 1 tablet by mouth daily.    [name] glucose blood test strip Use as instructed 100 each 0  . metFORMIN (GLUCOPHAGE) 500 MG tablet Take 1 tablet (500 mg  total) by mouth daily with breakfast. 30 tablet 0  . naproxen (NAPROSYN) 500 MG tablet Take 500 mg by mouth as needed.     . simvastatin (ZOCOR) 40 MG tablet Take 40 mg by mouth daily.    [name] spironolactone (ALDACTONE) 25 MG tablet Take 25 mg by mouth.    . Vitamin D, Ergocalciferol, (DRISDOL) 1.25 MG (50000 UT) CAPS capsule Take 1 capsule (50,000 Units total) by mouth every 7 (seven) days. 4 capsule 0   No current facility-administered medications on file prior to visit.      PAST MEDICAL HISTORY: Past Medical History:  Diagnosis Date  . Asthma   . Back pain   . Diabetes ([place])   . Hip pain   . Hyperlipidemia   . Hypertension   . Joint pain   . Knee pain   . Neuropathy   . Prediabetes     PAST SURGICAL HISTORY: Past Surgical History:  Procedure Laterality Date  . cyst removal     Right ankle 2016  . fibroid removal     2001-2003    SOCIAL HISTORY: Social History   Tobacco Use  . Smoking status: Never Smoker  . Smokeless tobacco: Never Used  Substance Use Topics  . Alcohol use: Not on file  . Drug use: Not on file    FAMILY HISTORY: Family History  Problem Relation Age of Onset  . Diabetes Mother   . Hyperlipidemia Mother   . Hypertension Mother   . Sleep apnea Mother   . Obesity Mother   . Heart disease Father     ROS: Review of Systems  Constitutional: Positive for malaise/fatigue. Negative for weight loss.  Gastrointestinal: Negative for diarrhea, nausea and vomiting.  Genitourinary:       Negative for polyuria  Musculoskeletal:       Negative for muscle weakness  Endo/Heme/Allergies:       Negative for hypoglycemia Positive for polyphagia    PHYSICAL EXAM: Blood pressure 114/73, pulse 79, temperature 98 F (36.7 C), temperature source Oral, height 5\' 6"  (1.676 m), weight 263 lb (119.3 kg), SpO2 97 %. Body mass index is 42.45 kg/m. Physical Exam Vitals signs reviewed.  Constitutional:      Appearance: Normal appearance. She is obese.  Cardiovascular:     Rate and Rhythm: Normal rate.     Pulses: Normal pulses.  Pulmonary:     Effort: Pulmonary effort is normal.  Musculoskeletal: Normal range of motion.  Skin:    General: Skin is warm and dry.  Neurological:     Mental Status: She is alert and oriented to person, place, and time.  Psychiatric:        Mood and Affect: Mood normal.        Behavior: Behavior normal.     RECENT LABS AND TESTS: BMET    Component Value Date/Time   NA 140 04/15/2018  0944   K 4.6 04/15/2018 0944   CL 103 04/15/2018 0944   CO2 18 (L) 04/15/2018 0944   GLUCOSE 85 04/15/2018 0944   BUN 11 04/15/2018 0944   CREATININE 0.72 04/15/2018 0944   CALCIUM 9.1 04/15/2018 0944   GFRNONAA 91 04/15/2018 0944   GFRAA 105 04/15/2018 0944   Lab Results  Component Value Date   HGBA1C 5.7 (H) 04/15/2018   Lab Results  Component Value Date   INSULIN 5.6 04/15/2018   CBC    Component Value Date/Time   WBC 3.6 04/15/2018 0944   RBC 4.88 04/15/2018 0944  HGB 13.9 04/15/2018 0944   HCT 41.6 04/15/2018 0944   MCV 85 04/15/2018 0944   MCH 28.5 04/15/2018 0944   MCHC 33.4 04/15/2018 0944   RDW 12.3 04/15/2018 0944   LYMPHSABS 1.7 04/15/2018 0944   EOSABS 0.1 04/15/2018 0944   BASOSABS 0.0 04/15/2018 0944   Iron/TIBC/Ferritin/ %Sat No results found for: IRON, TIBC, FERRITIN, IRONPCTSAT Lipid Panel     Component Value Date/Time   CHOL 164 04/15/2018 0944   TRIG 109 04/15/2018 0944   HDL 49 04/15/2018 0944   LDLCALC 93 04/15/2018 0944   Hepatic Function Panel     Component Value Date/Time   PROT 6.9 04/15/2018 0944   ALBUMIN 4.2 04/15/2018 0944   AST 22 04/15/2018 0944   ALT 12 04/15/2018 0944   ALKPHOS 63 04/15/2018 0944   BILITOT 0.4 04/15/2018 0944      Component Value Date/Time   TSH 1.380 04/15/2018 0944    Ref. Range 04/15/2018 09:44  Vitamin D, 25-Hydroxy Latest Ref Range: 30.0 - 100.0 ng/mL 11.0 (L)      OBESITY BEHAVIORAL INTERVENTION VISIT  Today's visit was # 3   Starting weight: 264 lbs Starting date: 04/15/2018 Today's weight : 263 lbs  Today's date: 05/13/2018 Total lbs lost to date: 1   ASK: We discussed the diagnosis of obesity with Luan Moore today and Caren Griffins agreed to give [name] permission to discuss obesity behavioral modification therapy today.  ASSESS: Valli has the diagnosis of obesity and her BMI today is 42.47 Shagun is in the action stage of change   ADVISE: Hooria was educated on the multiple  health risks of obesity as well as the benefit of weight loss to improve her health. She was advised of the need for long term treatment and the importance of lifestyle modifications to improve her current health and to decrease her risk of future health problems.  AGREE: Multiple dietary modification options and treatment options were discussed and  Mattia agreed to follow the recommendations documented in the above note.  ARRANGE: Tanisia was educated on the importance of frequent visits to treat obesity as outlined per CMS and USPSTF guidelines and agreed to schedule her next follow up appointment today.  I,Tammy Wysor, am acting as Location manager for [employer], FNP-C.  I have reviewed the above documentation for accuracy and completeness, and I agree with the above.  - Adam Demary, FNP-C.

## 2018-05-28 ENCOUNTER — Other Ambulatory Visit (INDEPENDENT_AMBULATORY_CARE_PROVIDER_SITE_OTHER): Payer: Self-pay | Admitting: Family Medicine

## 2018-05-28 DIAGNOSIS — R7303 Prediabetes: Secondary | ICD-10-CM

## 2018-05-29 ENCOUNTER — Ambulatory Visit (INDEPENDENT_AMBULATORY_CARE_PROVIDER_SITE_OTHER): Payer: BC Managed Care – PPO | Admitting: Family Medicine

## 2018-05-29 ENCOUNTER — Encounter (INDEPENDENT_AMBULATORY_CARE_PROVIDER_SITE_OTHER): Payer: Self-pay | Admitting: Family Medicine

## 2018-05-29 VITALS — BP 135/77 | HR 71 | Temp 98.2°F | Ht 66.0 in | Wt 263.0 lb

## 2018-05-29 DIAGNOSIS — Z9189 Other specified personal risk factors, not elsewhere classified: Secondary | ICD-10-CM | POA: Diagnosis not present

## 2018-05-29 DIAGNOSIS — Z6841 Body Mass Index (BMI) 40.0 and over, adult: Secondary | ICD-10-CM

## 2018-05-29 DIAGNOSIS — E559 Vitamin D deficiency, unspecified: Secondary | ICD-10-CM | POA: Diagnosis not present

## 2018-05-29 DIAGNOSIS — R7303 Prediabetes: Secondary | ICD-10-CM

## 2018-05-29 MED ORDER — METFORMIN HCL 500 MG PO TABS: 500.0000 mg | ORAL_TABLET | Freq: Every day | ORAL | 0 refills | Status: DC

## 2018-05-29 MED ORDER — VITAMIN D (ERGOCALCIFEROL) 1.25 MG (50000 UNIT) PO CAPS: 50000.0000 [IU] | ORAL_CAPSULE | ORAL | 0 refills | Status: DC

## 2018-06-02 ENCOUNTER — Encounter (INDEPENDENT_AMBULATORY_CARE_PROVIDER_SITE_OTHER): Payer: Self-pay | Admitting: Family Medicine

## 2018-06-02 ENCOUNTER — Other Ambulatory Visit (INDEPENDENT_AMBULATORY_CARE_PROVIDER_SITE_OTHER): Payer: Self-pay | Admitting: Family Medicine

## 2018-06-02 DIAGNOSIS — E559 Vitamin D deficiency, unspecified: Secondary | ICD-10-CM

## 2018-06-02 NOTE — Progress Notes (Signed)
Office: [phone]  /  Fax: [phone]   HPI:   Chief Complaint: OBESITY [name] is here to discuss her progress with her obesity treatment plan. She is on the Category 3 plan and is following her eating plan approximately 100 % of the time. She states she is at the gym for 35-40 minutes 3 times per week. [name] is not using snack calories except for occasional string cheese. She does add creamer to her coffee and does not count calories (1 cup per day). She is frustrated with lack of weight loss despite good compliance with meal plan.  Her weight is 263 lb (119.3 kg) today and has not lost weight since her last visit. She has lost 1 lb since starting treatment with [name].  Pre-Diabetes [name] has a diagnosis of pre-diabetes based on her elevated Hgb A1c and was informed this puts her at greater risk of developing diabetes. She is taking metformin currently and continues to work on diet and exercise to decrease risk of diabetes. She denies polyphagia or hypoglycemia.  Vitamin D Deficiency [name] has a diagnosis of vitamin D deficiency. She is currently taking prescription Vit D, but level is not at goal. Last Vit D was 11 on 04/15/2018. She denies nausea, vomiting or muscle weakness.  At risk for osteopenia and osteoporosis [name] is at higher risk of osteopenia and osteoporosis due to vitamin D deficiency.   ASSESSMENT AND PLAN:  Prediabetes - Plan: metFORMIN (GLUCOPHAGE) 500 MG tablet  Vitamin D deficiency - Plan: Vitamin D, Ergocalciferol, (DRISDOL) 1.25 MG (50000 UT) CAPS capsule  At risk for osteoporosis  Class 3 severe obesity with serious comorbidity and body mass index (BMI) of 40.0 to 44.9 in adult, unspecified obesity type [name])  PLAN:  Pre-Diabetes [name] will continue to work on weight loss, exercise, and decreasing simple carbohydrates in her diet to help decrease the risk of diabetes. We dicussed metformin including benefits and risks. She was informed that  eating too many simple carbohydrates or too many calories at one sitting increases the likelihood of GI side effects. Armie agrees to continue taking metformin 500 mg q AM #30 and we will refill for 1 month. Emmani agrees to follow up with our clinic in 2 weeks with our registered dietitian and in 4 weeks with myself  as directed to monitor her progress.  Vitamin D Deficiency [name] was informed that low vitamin D levels contributes to fatigue and are associated with obesity, breast, and colon cancer. [name] agrees to continue taking prescription Vit D @50 ,000 IU every week #4 and we will refill for 1 month. She will follow up for routine testing of vitamin D, at least 2-3 times per year. She was informed of the risk of over-replacement of vitamin D and agrees to not increase her dose unless she discusses this with [name] first. [name] agrees to follow up with our clinic in 2 weeks with our registered dietitian and in 4 weeks with myself.  At risk for osteopenia and osteoporosis [name] was given extended (15 minutes) osteoporosis prevention counseling today. [name] is at risk for osteopenia and osteoporsis due to her vitamin D deficiency. She was encouraged to take her vitamin D and follow her higher calcium diet and increase strengthening exercise to help strengthen her bones and decrease her risk of osteopenia and osteoporosis.  Obesity [name] is currently in the action stage of change. As such, her goal is to continue with weight loss efforts She has agreed to follow the Category 3 plan  [name] has been instructed to work up to a goal of 150 minutes of combined cardio and strengthening exercise per week or as above for weight loss and overall health benefits. We discussed the following Behavioral Modification Strategies today: work on meal planning and easy cooking plans and planning for success Added breakfast options and lunch options. [name] is to count all extra calories and keep< 300  calories.  [name] has agreed to follow up with our clinic in 2 weeks with our registered dietitian and in 4 weeks with myself. She was informed of the importance of frequent follow up visits to maximize her success with intensive lifestyle modifications for her multiple health conditions.  ALLERGIES: No Known Allergies  MEDICATIONS: Current Outpatient Medications on File Prior to Visit  Medication Sig Dispense Refill  . acetaminophen (TYLENOL) 500 MG tablet Take 500 mg by mouth every 6 (six) hours as needed.    [name] amLODipine-olmesartan (AZOR) 10-40 MG per tablet Take 1 tablet by mouth daily.    [name] glucose blood test strip Use as instructed 100 each 0  . naproxen (NAPROSYN) 500 MG tablet Take 500 mg by mouth as needed.     . simvastatin (ZOCOR) 40 MG tablet Take 40 mg by mouth daily.    [name] spironolactone (ALDACTONE) 25 MG tablet Take 25 mg by mouth.     No current facility-administered medications on file prior to visit.     PAST MEDICAL HISTORY: Past Medical History:  Diagnosis Date  . Asthma   . Back pain   . Diabetes ([place])   . Hip pain   . Hyperlipidemia   . Hypertension   . Joint pain   . Knee pain   . Neuropathy   . Prediabetes     PAST SURGICAL HISTORY: Past Surgical History:  Procedure Laterality Date  . cyst removal     Right ankle 2016  . fibroid removal     2001-2003    SOCIAL HISTORY: Social History   Tobacco Use  . Smoking status: Never Smoker  . Smokeless tobacco: Never Used  Substance Use Topics  . Alcohol use: Not on file  . Drug use: Not on file    FAMILY HISTORY: Family History  Problem Relation Age of Onset  . Diabetes Mother   . Hyperlipidemia Mother   . Hypertension Mother   . Sleep apnea Mother   . Obesity Mother   . Heart disease Father     ROS: Review of Systems  Constitutional: Negative for weight loss.  Gastrointestinal: Negative for nausea and vomiting.  Musculoskeletal:       Negative muscle weakness  Endo/Heme/Allergies:        Negative hypoglycemia Negative polyphagia    PHYSICAL EXAM: Blood pressure 135/77, pulse 71, temperature 98.2 F (36.8 C), temperature source Oral, height 5\' 6"  (1.676 m), weight 263 lb (119.3 kg), SpO2 99 %. Body mass index is 42.45 kg/m. Physical Exam Vitals signs reviewed.  Constitutional:      Appearance: Normal appearance. She is obese.  Cardiovascular:     Rate and Rhythm: Normal rate.     Pulses: Normal pulses.  Pulmonary:     Effort: Pulmonary effort is normal.     Breath sounds: Normal breath sounds.  Musculoskeletal: Normal range of motion.  Skin:    General: Skin is warm and dry.  Neurological:     Mental Status: She is alert and oriented to person, place, and time.  Psychiatric:        Mood  and Affect: Mood normal.        Behavior: Behavior normal.     RECENT LABS AND TESTS: BMET    Component Value Date/Time   NA 140 04/15/2018 0944   K 4.6 04/15/2018 0944   CL 103 04/15/2018 0944   CO2 18 (L) 04/15/2018 0944   GLUCOSE 85 04/15/2018 0944   BUN 11 04/15/2018 0944   CREATININE 0.72 04/15/2018 0944   CALCIUM 9.1 04/15/2018 0944   GFRNONAA 91 04/15/2018 0944   GFRAA 105 04/15/2018 0944   Lab Results  Component Value Date   HGBA1C 5.7 (H) 04/15/2018   Lab Results  Component Value Date   INSULIN 5.6 04/15/2018   CBC    Component Value Date/Time   WBC 3.6 04/15/2018 0944   RBC 4.88 04/15/2018 0944   HGB 13.9 04/15/2018 0944   HCT 41.6 04/15/2018 0944   MCV 85 04/15/2018 0944   MCH 28.5 04/15/2018 0944   MCHC 33.4 04/15/2018 0944   RDW 12.3 04/15/2018 0944   LYMPHSABS 1.7 04/15/2018 0944   EOSABS 0.1 04/15/2018 0944   BASOSABS 0.0 04/15/2018 0944   Iron/TIBC/Ferritin/ %Sat No results found for: IRON, TIBC, FERRITIN, IRONPCTSAT Lipid Panel     Component Value Date/Time   CHOL 164 04/15/2018 0944   TRIG 109 04/15/2018 0944   HDL 49 04/15/2018 0944   LDLCALC 93 04/15/2018 0944   Hepatic Function Panel     Component Value  Date/Time   PROT 6.9 04/15/2018 0944   ALBUMIN 4.2 04/15/2018 0944   AST 22 04/15/2018 0944   ALT 12 04/15/2018 0944   ALKPHOS 63 04/15/2018 0944   BILITOT 0.4 04/15/2018 0944      Component Value Date/Time   TSH 1.380 04/15/2018 0944      OBESITY BEHAVIORAL INTERVENTION VISIT  Today's visit was # 4   Starting weight: 264 lbs Starting date: 04/15/2018 Today's weight : 263 lbs  Today's date: 05/29/2018 Total lbs lost to date: 1    05/29/2018  Height 5\' 6"  (1.676 m)  Weight 263 lb (119.3 kg)  BMI (Calculated) 42.47  BLOOD PRESSURE - SYSTOLIC 093  BLOOD PRESSURE - DIASTOLIC 77   Body Fat % 26.7 %  Total Body Water (lbs) 93.2 lbs     ASK: We discussed the diagnosis of obesity with Luan Moore today and Barbaraann agreed to give [name] permission to discuss obesity behavioral modification therapy today.  ASSESS: Nyra has the diagnosis of obesity and her BMI today is 42.47 Darling is in the action stage of change   ADVISE: Kaelea was educated on the multiple health risks of obesity as well as the benefit of weight loss to improve her health. She was advised of the need for long term treatment and the importance of lifestyle modifications to improve her current health and to decrease her risk of future health problems.  AGREE: Multiple dietary modification options and treatment options were discussed and  [name] agreed to follow the recommendations documented in the above note.  ARRANGE: Tazaria was educated on the importance of frequent visits to treat obesity as outlined per CMS and USPSTF guidelines and agreed to schedule her next follow up appointment today.  [name], am acting as Location manager for [employer], FNP-C.  I have reviewed the above documentation for accuracy and completeness, and I agree with the above.  - Anup Brigham, FNP-C.

## 2018-06-18 ENCOUNTER — Ambulatory Visit (INDEPENDENT_AMBULATORY_CARE_PROVIDER_SITE_OTHER): Payer: BC Managed Care – PPO | Admitting: Dietician

## 2018-06-26 ENCOUNTER — Ambulatory Visit (INDEPENDENT_AMBULATORY_CARE_PROVIDER_SITE_OTHER): Payer: BC Managed Care – PPO | Admitting: Family Medicine

## 2018-06-26 ENCOUNTER — Encounter (INDEPENDENT_AMBULATORY_CARE_PROVIDER_SITE_OTHER): Payer: Self-pay

## 2018-07-02 ENCOUNTER — Encounter (INDEPENDENT_AMBULATORY_CARE_PROVIDER_SITE_OTHER): Payer: Self-pay

## 2018-07-02 ENCOUNTER — Other Ambulatory Visit (INDEPENDENT_AMBULATORY_CARE_PROVIDER_SITE_OTHER): Payer: Self-pay | Admitting: Family Medicine

## 2018-07-02 DIAGNOSIS — R7303 Prediabetes: Secondary | ICD-10-CM

## 2018-07-02 DIAGNOSIS — E559 Vitamin D deficiency, unspecified: Secondary | ICD-10-CM

## 2018-07-03 ENCOUNTER — Other Ambulatory Visit: Payer: Self-pay

## 2018-07-03 ENCOUNTER — Encounter (INDEPENDENT_AMBULATORY_CARE_PROVIDER_SITE_OTHER): Payer: Self-pay | Admitting: Family Medicine

## 2018-07-03 ENCOUNTER — Ambulatory Visit (INDEPENDENT_AMBULATORY_CARE_PROVIDER_SITE_OTHER): Payer: BC Managed Care – PPO | Admitting: Family Medicine

## 2018-07-03 DIAGNOSIS — Z6841 Body Mass Index (BMI) 40.0 and over, adult: Secondary | ICD-10-CM | POA: Diagnosis not present

## 2018-07-03 DIAGNOSIS — E559 Vitamin D deficiency, unspecified: Secondary | ICD-10-CM | POA: Diagnosis not present

## 2018-07-03 DIAGNOSIS — R7303 Prediabetes: Secondary | ICD-10-CM

## 2018-07-03 MED ORDER — METFORMIN HCL 500 MG PO TABS: 500.0000 mg | ORAL_TABLET | Freq: Every day | ORAL | 0 refills | Status: DC

## 2018-07-03 MED ORDER — VITAMIN D (ERGOCALCIFEROL) 1.25 MG (50000 UNIT) PO CAPS: 50000.0000 [IU] | ORAL_CAPSULE | ORAL | 0 refills | Status: DC

## 2018-07-07 ENCOUNTER — Encounter (INDEPENDENT_AMBULATORY_CARE_PROVIDER_SITE_OTHER): Payer: Self-pay | Admitting: Family Medicine

## 2018-07-07 NOTE — Progress Notes (Signed)
Office: [phone]  /  Fax: [phone] TeleHealth Visit:  [name] has verbally consented to this TeleHealth visit today. The patient is located at home, the provider is located at the [employer] [facility] office. The participants in this visit include the listed provider and patient. The visit was conducted today via telephone call as patient was unable to connect on Webex.  HPI:   Chief Complaint: OBESITY [name] is here to discuss her progress with her obesity treatment plan. She is on the Category 3 plan and is following her eating plan approximately 75% of the time. She states she is walking 2000 steps 7 times per week. [name] reports weighing 260.6 on her home scales today. She thinks she has lost a few lbs. [name] states she is unable to find bread. 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 She states she is eating out less compared to previously. We were unable to weigh the patient today for this TeleHealth visit. She feels as if she has lost weight since her last visit. She has lost 1 lb since starting treatment with [name].  Pre-Diabetes [name] has a diagnosis of prediabetes based on her elevated Hgb A1c and was informed this puts her at greater risk of developing diabetes. Her last A1c level was reported to be 5.7 on 04/15/2018. She is taking metformin currently and continues to work on diet and exercise to decrease risk of diabetes. She reports occasional hypoglycemia and also reports polyphagia when she forgets to eat lunch.  Vitamin D deficiency [name] has a diagnosis of Vitamin D deficiency, which is not at goal. Her last Vitamin D level was reported to be 11.0 on 04/15/2018. She is currently taking prescription Vit D and denies nausea, vomiting or muscle weakness.  ASSESSMENT AND PLAN:  Prediabetes - Plan: metFORMIN (GLUCOPHAGE) 500 MG tablet  Vitamin D deficiency - Plan: Vitamin D, Ergocalciferol, (DRISDOL) 1.25 MG (50000 UT) CAPS capsule   Class 3 severe obesity with serious comorbidity and body mass index (BMI) of 40.0 to 44.9 in adult, unspecified obesity type [name])  PLAN:  Pre-Diabetes [name] will continue to work on weight loss, exercise, and decreasing simple carbohydrates in her diet to help decrease the risk of diabetes.  [name] is currently taking metformin and a refill prescription was written today for 500 mg QAM #30 with 0 refills. [name] agrees to follow-up with our clinic in 2-3 weeks.  Vitamin D Deficiency [name] was informed that low Vitamin D levels contributes to fatigue and are associated with obesity, breast, and colon cancer. She agrees to continue to take prescription Vit D @ 50,000 IU every week #4 with 0 refills and will follow-up for routine testing of Vitamin D, at least 2-3 times per year. She was informed of the risk of over-replacement of Vitamin D and agrees to not increase her dose unless she discusses this with [name] first. [name] agrees to follow-up with our clinic in 2-3 weeks.  Obesity [name] is currently in the action stage of change. As such, her goal is to continue with weight loss efforts. She has agreed to follow the Category 3 plan. [name] has been instructed to continue 2000 steps a day. We discussed the following Behavioral Modification Strategies today: increasing lean protein intake, no skipping meals, and planning for success.  [name] has agreed to follow-up with our clinic in 2-3 weeks. She was informed of the importance of frequent follow-up visits to maximize her success with intensive lifestyle modifications for her  multiple health conditions.  ALLERGIES: No Known Allergies  MEDICATIONS: Current Outpatient Medications on File Prior to Visit  Medication Sig Dispense Refill  . acetaminophen (TYLENOL) 500 MG tablet Take 500 mg by mouth every 6 (six) hours as needed.    [name] amLODipine-olmesartan (AZOR) 10-40 MG per tablet Take 1 tablet by mouth daily.    [name] glucose blood test  strip Use as instructed 100 each 0  . naproxen (NAPROSYN) 500 MG tablet Take 500 mg by mouth as needed.     . simvastatin (ZOCOR) 40 MG tablet Take 40 mg by mouth daily.    [name] spironolactone (ALDACTONE) 25 MG tablet Take 25 mg by mouth.     No current facility-administered medications on file prior to visit.     PAST MEDICAL HISTORY: Past Medical History:  Diagnosis Date  . Asthma   . Back pain   . Diabetes ([place])   . Hip pain   . Hyperlipidemia   . Hypertension   . Joint pain   . Knee pain   . Neuropathy   . Prediabetes     PAST SURGICAL HISTORY: Past Surgical History:  Procedure Laterality Date  . cyst removal     Right ankle 2016  . fibroid removal     2001-2003    SOCIAL HISTORY: Social History   Tobacco Use  . Smoking status: Never Smoker  . Smokeless tobacco: Never Used  Substance Use Topics  . Alcohol use: Not on file  . Drug use: Not on file    FAMILY HISTORY: Family History  Problem Relation Age of Onset  . Diabetes Mother   . Hyperlipidemia Mother   . Hypertension Mother   . Sleep apnea Mother   . Obesity Mother   . Heart disease Father    ROS: Review of Systems  Gastrointestinal: Negative for nausea and vomiting.  Musculoskeletal:       Negative for muscle weakness.  Endo/Heme/Allergies:       Positive for occasional hypoglycemia. Positive for polyphagia when forgets to eat lunch.   PHYSICAL EXAM: Pt in no acute distress  RECENT LABS AND TESTS: BMET    Component Value Date/Time   NA 140 04/15/2018 0944   K 4.6 04/15/2018 0944   CL 103 04/15/2018 0944   CO2 18 (L) 04/15/2018 0944   GLUCOSE 85 04/15/2018 0944   BUN 11 04/15/2018 0944   CREATININE 0.72 04/15/2018 0944   CALCIUM 9.1 04/15/2018 0944   GFRNONAA 91 04/15/2018 0944   GFRAA 105 04/15/2018 0944   Lab Results  Component Value Date   HGBA1C 5.7 (H) 04/15/2018   Lab Results  Component Value Date   INSULIN 5.6 04/15/2018   CBC    Component Value Date/Time   WBC 3.6  04/15/2018 0944   RBC 4.88 04/15/2018 0944   HGB 13.9 04/15/2018 0944   HCT 41.6 04/15/2018 0944   MCV 85 04/15/2018 0944   MCH 28.5 04/15/2018 0944   MCHC 33.4 04/15/2018 0944   RDW 12.3 04/15/2018 0944   LYMPHSABS 1.7 04/15/2018 0944   EOSABS 0.1 04/15/2018 0944   BASOSABS 0.0 04/15/2018 0944   Iron/TIBC/Ferritin/ %Sat No results found for: IRON, TIBC, FERRITIN, IRONPCTSAT Lipid Panel     Component Value Date/Time   CHOL 164 04/15/2018 0944   TRIG 109 04/15/2018 0944   HDL 49 04/15/2018 0944   LDLCALC 93 04/15/2018 0944   Hepatic Function Panel     Component Value Date/Time   PROT 6.9 04/15/2018 0944  ALBUMIN 4.2 04/15/2018 0944   AST 22 04/15/2018 0944   ALT 12 04/15/2018 0944   ALKPHOS 63 04/15/2018 0944   BILITOT 0.4 04/15/2018 0944      Component Value Date/Time   TSH 1.380 04/15/2018 0944   Results for GUELDA, BATSON (MRN [id number]) as of 07/07/2018 07:40  Ref. Range 04/15/2018 09:44  Vitamin D, 25-Hydroxy Latest Ref Range: 30.0 - 100.0 ng/mL 11.0 (L)    I, Michaelene Song, am acting as Location manager for [employer], FNP-C.  I have reviewed the above documentation for accuracy and completeness, and I agree with the above.  -  , FNP-C.

## 2018-07-22 ENCOUNTER — Encounter (INDEPENDENT_AMBULATORY_CARE_PROVIDER_SITE_OTHER): Payer: Self-pay | Admitting: Family Medicine

## 2018-07-22 ENCOUNTER — Ambulatory Visit (INDEPENDENT_AMBULATORY_CARE_PROVIDER_SITE_OTHER): Payer: BC Managed Care – PPO | Admitting: Family Medicine

## 2018-07-22 ENCOUNTER — Other Ambulatory Visit: Payer: Self-pay

## 2018-07-22 DIAGNOSIS — E559 Vitamin D deficiency, unspecified: Secondary | ICD-10-CM | POA: Diagnosis not present

## 2018-07-22 DIAGNOSIS — R7303 Prediabetes: Secondary | ICD-10-CM

## 2018-07-22 DIAGNOSIS — Z6841 Body Mass Index (BMI) 40.0 and over, adult: Secondary | ICD-10-CM

## 2018-07-22 MED ORDER — METFORMIN HCL 500 MG PO TABS: 500.0000 mg | ORAL_TABLET | Freq: Every day | ORAL | 0 refills | Status: DC

## 2018-07-22 MED ORDER — VITAMIN D (ERGOCALCIFEROL) 1.25 MG (50000 UNIT) PO CAPS: 50000.0000 [IU] | ORAL_CAPSULE | ORAL | 0 refills | Status: DC

## 2018-07-22 NOTE — Progress Notes (Signed)
Office: [phone]  /  Fax: [phone] TeleHealth Visit:  [name] has verbally consented to this TeleHealth visit today. The patient is located at home, the provider is located at the [employer] [facility] office. The participants in this visit include the listed provider and patient. The visit was conducted today via telephone call.  HPI:   Chief Complaint: OBESITY [name] is here to discuss her progress with her obesity treatment plan. She is on the Category 3 plan and is following her eating plan approximately 80-90% of the time. She states she is walking 15 minutes 5 times per week. [name] states she weighed 258.4 lbs this a.m reflecting an approximate 2 lb weight loss. She reports sticking to the plan very well. She does state she occasionally skips meals.  We were unable to weigh the patient today for this TeleHealth visit. She feels as if she has lost weight since her last visit. She has lost 1 lb since starting treatment with [name].  Pre-Diabetes [name] has a diagnosis of prediabetes based on her elevated Hgb A1c and was informed this puts her at greater risk of developing diabetes. Her last A1c was reported to be 5.7 on 04/15/2018. She is taking metformin currently and she states it helps with her appetite. She continues to work on diet and exercise to decrease risk of diabetes. She denies hypoglycemia or polyphagia.  Vitamin D deficiency [name] has a diagnosis of Vitamin D deficiency, which is not at goal. Her last Vitamin D level was reported to be 11.0 on 04/15/2018. She is currently taking prescription Vit D and denies nausea, vomiting or muscle weakness.  ASSESSMENT AND PLAN:  Prediabetes - Plan: metFORMIN (GLUCOPHAGE) 500 MG tablet  Vitamin D deficiency - Plan: Vitamin D, Ergocalciferol, (DRISDOL) 1.25 MG (50000 UT) CAPS capsule  Class 3 severe obesity with serious comorbidity and body mass index (BMI) of 40.0 to 44.9 in adult, unspecified obesity type [facility])   PLAN:  Pre-Diabetes [name] will continue to work on weight loss, exercise, and decreasing simple carbohydrates in her diet to help decrease the risk of diabetes. [name] is currently taking metformin and a refill prescription was written today for 500 mg QAM #30 with 0 refills. She agrees to follow-up with our clinic in 2 weeks as directed to monitor her progress.  Vitamin D Deficiency [name] was informed that low Vitamin D levels contributes to fatigue and are associated with obesity, breast, and colon cancer. She agrees to continue to take prescription Vit D @ 50,000 IU every week #4 with 0 refills and will follow-up for routine testing of Vitamin D, at least 2-3 times per year. She was informed of the risk of over-replacement of Vitamin D and agrees to not increase her dose unless she discusses this with [name] first. [name] agrees to follow-up with our clinic in 2 weeks.  Obesity [name] is currently in the action stage of change. As such, her goal is to continue with weight loss efforts. She has agreed to follow the Category 3 plan. I discussed with her substitutions for 2 oz. of meat such as [employer] Milk or light cheese. [name] has been instructed to continue walking 15 minutes 5 days per week for weight loss and overall health benefits. We discussed the following Behavioral Modification Strategies today: increasing lean protein intake, no skipping meals, and planning for success.  [name] has agreed to follow-up with our clinic in 2 weeks. She was informed of the importance of frequent follow-up visits to maximize  her success with intensive lifestyle modifications for her multiple health conditions.  ALLERGIES: No Known Allergies  MEDICATIONS: Current Outpatient Medications on File Prior to Visit  Medication Sig Dispense Refill  . acetaminophen (TYLENOL) 500 MG tablet Take 500 mg by mouth every 6 (six) hours as needed.    [name] Kitchen amLODipine-olmesartan (AZOR) 10-40 MG per tablet Take 1  tablet by mouth daily.    [name] Kitchen glucose blood test strip Use as instructed 100 each 0  . naproxen (NAPROSYN) 500 MG tablet Take 500 mg by mouth as needed.     . simvastatin (ZOCOR) 40 MG tablet Take 40 mg by mouth daily.    [name] Kitchen spironolactone (ALDACTONE) 25 MG tablet Take 25 mg by mouth.     No current facility-administered medications on file prior to visit.     PAST MEDICAL HISTORY: Past Medical History:  Diagnosis Date  . Asthma   . Back pain   . Diabetes ([place])   . Hip pain   . Hyperlipidemia   . Hypertension   . Joint pain   . Knee pain   . Neuropathy   . Prediabetes     PAST SURGICAL HISTORY: Past Surgical History:  Procedure Laterality Date  . cyst removal     Right ankle 2016  . fibroid removal     2001-2003    SOCIAL HISTORY: Social History   Tobacco Use  . Smoking status: Never Smoker  . Smokeless tobacco: Never Used  Substance Use Topics  . Alcohol use: Not on file  . Drug use: Not on file    FAMILY HISTORY: Family History  Problem Relation Age of Onset  . Diabetes Mother   . Hyperlipidemia Mother   . Hypertension Mother   . Sleep apnea Mother   . Obesity Mother   . Heart disease Father    ROS: Review of Systems  Gastrointestinal: Negative for nausea and vomiting.  Musculoskeletal:       Negative for muscle weakness.  Endo/Heme/Allergies:       Negative for hypoglycemia. Negative for polyphagia.   PHYSICAL EXAM: Pt in no acute distress  RECENT LABS AND TESTS: BMET    Component Value Date/Time   NA 140 04/15/2018 0944   K 4.6 04/15/2018 0944   CL 103 04/15/2018 0944   CO2 18 (L) 04/15/2018 0944   GLUCOSE 85 04/15/2018 0944   BUN 11 04/15/2018 0944   CREATININE 0.72 04/15/2018 0944   CALCIUM 9.1 04/15/2018 0944   GFRNONAA 91 04/15/2018 0944   GFRAA 105 04/15/2018 0944   Lab Results  Component Value Date   HGBA1C 5.7 (H) 04/15/2018   Lab Results  Component Value Date   INSULIN 5.6 04/15/2018   CBC    Component Value Date/Time    WBC 3.6 04/15/2018 0944   RBC 4.88 04/15/2018 0944   HGB 13.9 04/15/2018 0944   HCT 41.6 04/15/2018 0944   MCV 85 04/15/2018 0944   MCH 28.5 04/15/2018 0944   MCHC 33.4 04/15/2018 0944   RDW 12.3 04/15/2018 0944   LYMPHSABS 1.7 04/15/2018 0944   EOSABS 0.1 04/15/2018 0944   BASOSABS 0.0 04/15/2018 0944   Iron/TIBC/Ferritin/ %Sat No results found for: IRON, TIBC, FERRITIN, IRONPCTSAT Lipid Panel     Component Value Date/Time   CHOL 164 04/15/2018 0944   TRIG 109 04/15/2018 0944   HDL 49 04/15/2018 0944   LDLCALC 93 04/15/2018 0944   Hepatic Function Panel     Component Value Date/Time   PROT 6.9 04/15/2018  0944   ALBUMIN 4.2 04/15/2018 0944   AST 22 04/15/2018 0944   ALT 12 04/15/2018 0944   ALKPHOS 63 04/15/2018 0944   BILITOT 0.4 04/15/2018 0944      Component Value Date/Time   TSH 1.380 04/15/2018 0944   Results for Wentzell, GAYTHA RAYBOURN (MRN [id number]) as of 07/22/2018 13:41  Ref. Range 04/15/2018 09:44  Vitamin D, 25-Hydroxy Latest Ref Range: 30.0 - 100.0 ng/mL 11.0 (L)   I, Michaelene Song, am acting as Location manager for [employer], FNP-C.  I have reviewed the above documentation for accuracy and completeness, and I agree with the above.  - Juancarlos Crescenzo, FNP-C.

## 2018-07-23 ENCOUNTER — Encounter (INDEPENDENT_AMBULATORY_CARE_PROVIDER_SITE_OTHER): Payer: Self-pay | Admitting: Family Medicine

## 2018-07-29 ENCOUNTER — Other Ambulatory Visit (INDEPENDENT_AMBULATORY_CARE_PROVIDER_SITE_OTHER): Payer: Self-pay | Admitting: Family Medicine

## 2018-07-29 DIAGNOSIS — R7303 Prediabetes: Secondary | ICD-10-CM

## 2018-07-30 ENCOUNTER — Other Ambulatory Visit (INDEPENDENT_AMBULATORY_CARE_PROVIDER_SITE_OTHER): Payer: Self-pay | Admitting: Family Medicine

## 2018-07-30 DIAGNOSIS — E559 Vitamin D deficiency, unspecified: Secondary | ICD-10-CM

## 2018-08-05 ENCOUNTER — Ambulatory Visit (INDEPENDENT_AMBULATORY_CARE_PROVIDER_SITE_OTHER): Payer: BC Managed Care – PPO | Admitting: Family Medicine

## 2018-08-05 ENCOUNTER — Other Ambulatory Visit: Payer: Self-pay

## 2018-08-05 ENCOUNTER — Encounter (INDEPENDENT_AMBULATORY_CARE_PROVIDER_SITE_OTHER): Payer: Self-pay | Admitting: Family Medicine

## 2018-08-05 DIAGNOSIS — R7303 Prediabetes: Secondary | ICD-10-CM

## 2018-08-05 DIAGNOSIS — Z6841 Body Mass Index (BMI) 40.0 and over, adult: Secondary | ICD-10-CM | POA: Diagnosis not present

## 2018-08-05 NOTE — Progress Notes (Signed)
Office: [phone]  /  Fax: [phone] TeleHealth Visit:  [name] has verbally consented to this TeleHealth visit today. The patient is located at home, the provider is located at the [employer] [facility] office. The participants in this visit include the listed provider and patient. The visit was conducted today via telephone call (patient's computer not working). Call was 23 minutes.  HPI:   Chief Complaint: OBESITY [name] is here to discuss her progress with her obesity treatment plan. She is on the Category 3 plan and is following her eating plan approximately 70% of the time. She states she is walking 1200-1500 steps 7 times per week. [name] reports her meal skipping has improved and she is not skipping lunch. She states she weighed 258.6 lbs today, reflecting weight maintenance.  We were unable to weigh the patient today for this TeleHealth visit. She feels as if she has maintained her weight since her last visit. She has lost 1 lb since starting treatment with [name].  Pre-Diabetes [name] has a diagnosis of prediabetes based on her elevated Hgb A1c and was informed this puts her at greater risk of developing diabetes. She is taking metformin currently and continues to work on diet and exercise to decrease risk of diabetes. She denies polyphagia.  ASSESSMENT AND PLAN:  Prediabetes  Class 3 severe obesity with serious comorbidity and body mass index (BMI) of 40.0 to 44.9 in adult, unspecified obesity type [facility])  PLAN:  Pre-Diabetes [name] will continue to work on weight loss, exercise, and decreasing simple carbohydrates in her diet to help decrease the risk of diabetes.  [name] will continue metformin QAM and agrees to follow-up with our clinic in 2 weeks.  I spent > than 50% of the 15 minute visit on counseling as documented in the note.  Obesity [name] is currently in the action stage of change. As such, her goal is to continue with weight loss efforts.  She has agreed to follow the Category 3 plan. [name] has been instructed to increase her walking as tolerated for weight loss and overall health benefits. We discussed the following Behavioral Modification Strategies today: no skipping meals and planning for success.  [name] has agreed to follow-up with our clinic in 2 weeks. She was informed of the importance of frequent follow-up visits to maximize her success with intensive lifestyle modifications for her multiple health conditions.  ALLERGIES: No Known Allergies  MEDICATIONS: Current Outpatient Medications on File Prior to Visit  Medication Sig Dispense Refill  . acetaminophen (TYLENOL) 500 MG tablet Take 500 mg by mouth every 6 (six) hours as needed.    [name] amLODipine-olmesartan (AZOR) 10-40 MG per tablet Take 1 tablet by mouth daily.    [name] glucose blood test strip Use as instructed 100 each 0  . metFORMIN (GLUCOPHAGE) 500 MG tablet Take 1 tablet (500 mg total) by mouth daily with breakfast. 30 tablet 0  . naproxen (NAPROSYN) 500 MG tablet Take 500 mg by mouth as needed.     . simvastatin (ZOCOR) 40 MG tablet Take 40 mg by mouth daily.    [name] spironolactone (ALDACTONE) 25 MG tablet Take 25 mg by mouth.    . Vitamin D, Ergocalciferol, (DRISDOL) 1.25 MG (50000 UT) CAPS capsule Take 1 capsule (50,000 Units total) by mouth every 7 (seven) days. 4 capsule 0   No current facility-administered medications on file prior to visit.     PAST MEDICAL HISTORY: Past Medical History:  Diagnosis Date  . Asthma   .  Back pain   . Diabetes ([place])   . Hip pain   . Hyperlipidemia   . Hypertension   . Joint pain   . Knee pain   . Neuropathy   . Prediabetes     PAST SURGICAL HISTORY: Past Surgical History:  Procedure Laterality Date  . cyst removal     Right ankle 2016  . fibroid removal     2001-2003    SOCIAL HISTORY: Social History   Tobacco Use  . Smoking status: Never Smoker  . Smokeless tobacco: Never Used  Substance Use Topics   . Alcohol use: Not on file  . Drug use: Not on file    FAMILY HISTORY: Family History  Problem Relation Age of Onset  . Diabetes Mother   . Hyperlipidemia Mother   . Hypertension Mother   . Sleep apnea Mother   . Obesity Mother   . Heart disease Father    ROS: Review of Systems  Endo/Heme/Allergies:       Negative for polyphagia.   PHYSICAL EXAM: Pt in no acute distress  RECENT LABS AND TESTS: BMET    Component Value Date/Time   NA 140 04/15/2018 0944   K 4.6 04/15/2018 0944   CL 103 04/15/2018 0944   CO2 18 (L) 04/15/2018 0944   GLUCOSE 85 04/15/2018 0944   BUN 11 04/15/2018 0944   CREATININE 0.72 04/15/2018 0944   CALCIUM 9.1 04/15/2018 0944   GFRNONAA 91 04/15/2018 0944   GFRAA 105 04/15/2018 0944   Lab Results  Component Value Date   HGBA1C 5.7 (H) 04/15/2018   Lab Results  Component Value Date   INSULIN 5.6 04/15/2018   CBC    Component Value Date/Time   WBC 3.6 04/15/2018 0944   RBC 4.88 04/15/2018 0944   HGB 13.9 04/15/2018 0944   HCT 41.6 04/15/2018 0944   MCV 85 04/15/2018 0944   MCH 28.5 04/15/2018 0944   MCHC 33.4 04/15/2018 0944   RDW 12.3 04/15/2018 0944   LYMPHSABS 1.7 04/15/2018 0944   EOSABS 0.1 04/15/2018 0944   BASOSABS 0.0 04/15/2018 0944   Iron/TIBC/Ferritin/ %Sat No results found for: IRON, TIBC, FERRITIN, IRONPCTSAT Lipid Panel     Component Value Date/Time   CHOL 164 04/15/2018 0944   TRIG 109 04/15/2018 0944   HDL 49 04/15/2018 0944   LDLCALC 93 04/15/2018 0944   Hepatic Function Panel     Component Value Date/Time   PROT 6.9 04/15/2018 0944   ALBUMIN 4.2 04/15/2018 0944   AST 22 04/15/2018 0944   ALT 12 04/15/2018 0944   ALKPHOS 63 04/15/2018 0944   BILITOT 0.4 04/15/2018 0944      Component Value Date/Time   TSH 1.380 04/15/2018 0944   Results for [name], [name] (MRN [id number]) as of 08/05/2018 14:38  Ref. Range 04/15/2018 09:44  Vitamin D, 25-Hydroxy Latest Ref Range: 30.0 - 100.0 ng/mL 11.0 (L)   I,  Michaelene Song, am acting as Location manager for [employer], FNP-C.  I have reviewed the above documentation for accuracy and completeness, and I agree with the above.  - Prince Olivier, FNP-C.

## 2018-08-06 ENCOUNTER — Encounter (INDEPENDENT_AMBULATORY_CARE_PROVIDER_SITE_OTHER): Payer: Self-pay | Admitting: Family Medicine

## 2018-08-19 ENCOUNTER — Other Ambulatory Visit (INDEPENDENT_AMBULATORY_CARE_PROVIDER_SITE_OTHER): Payer: Self-pay | Admitting: Family Medicine

## 2018-08-19 DIAGNOSIS — E559 Vitamin D deficiency, unspecified: Secondary | ICD-10-CM

## 2018-08-21 ENCOUNTER — Other Ambulatory Visit (INDEPENDENT_AMBULATORY_CARE_PROVIDER_SITE_OTHER): Payer: Self-pay | Admitting: Family Medicine

## 2018-08-21 DIAGNOSIS — R7303 Prediabetes: Secondary | ICD-10-CM

## 2018-08-28 ENCOUNTER — Ambulatory Visit (INDEPENDENT_AMBULATORY_CARE_PROVIDER_SITE_OTHER): Payer: BC Managed Care – PPO | Admitting: Family Medicine

## 2018-08-28 ENCOUNTER — Encounter (INDEPENDENT_AMBULATORY_CARE_PROVIDER_SITE_OTHER): Payer: Self-pay | Admitting: Family Medicine

## 2018-08-28 ENCOUNTER — Other Ambulatory Visit: Payer: Self-pay

## 2018-08-28 DIAGNOSIS — R7303 Prediabetes: Secondary | ICD-10-CM

## 2018-08-28 DIAGNOSIS — E559 Vitamin D deficiency, unspecified: Secondary | ICD-10-CM | POA: Diagnosis not present

## 2018-08-28 DIAGNOSIS — Z6841 Body Mass Index (BMI) 40.0 and over, adult: Secondary | ICD-10-CM

## 2018-08-28 MED ORDER — METFORMIN HCL 500 MG PO TABS: 500.0000 mg | ORAL_TABLET | Freq: Two times a day (BID) | ORAL | 0 refills | Status: DC

## 2018-08-28 MED ORDER — VITAMIN D (ERGOCALCIFEROL) 1.25 MG (50000 UNIT) PO CAPS: 50000.0000 [IU] | ORAL_CAPSULE | ORAL | 0 refills | Status: DC

## 2018-08-28 NOTE — Progress Notes (Signed)
Office: [phone]  /  Fax: [phone] TeleHealth Visit:  [name] has verbally consented to this TeleHealth visit today. The patient is located at home, the provider is located at the [employer] [facility] office. The participants in this visit include the listed provider and patient. The visit was conducted today via telephone call.  HPI:   Chief Complaint: OBESITY [name] is here to discuss her progress with her obesity treatment plan. She is on the Category 3 plan and is following her eating plan approximately 75% of the time. She states she is doing calisthenics. [name] states her weight today is 257.6 and was 258.6 last office visit, reflecting a 1 lb weight loss. She is trying not to stress eat but does have 7 family members with [place] including her 24 year old mother. They are all doing well. She is trying to get back on the plan. She reports she has been eating too many carbs.  We were unable to weigh the patient today for this TeleHealth visit. She reports her weight today is 257.6 lbs. She has lost 1 lb since starting treatment with [name].  Pre-Diabetes [name] has a diagnosis of prediabetes based on her elevated Hgb A1c and was informed this puts her at greater risk of developing diabetes. She is taking metformin currently and continues to work on diet and exercise to decrease risk of diabetes. She denies hypoglycemia but does report polyphagia. Lab Results  Component Value Date   HGBA1C 5.7 (H) 04/15/2018    Vitamin D deficiency [name] has a diagnosis of Vitamin D deficiency, which is not at goal. Her last Vitamin D level was reported to be 11.0 on 04/15/2018. She is currently taking prescription Vit D and denies nausea, vomiting or muscle weakness.  ASSESSMENT AND PLAN:  Prediabetes - Plan: metFORMIN (GLUCOPHAGE) 500 MG tablet  Vitamin D deficiency - Plan: Vitamin D, Ergocalciferol, (DRISDOL) 1.25 MG (50000 UT) CAPS capsule  Class 3 severe obesity with  serious comorbidity and body mass index (BMI) of 40.0 to 44.9 in adult, unspecified obesity type [name])  PLAN:  Pre-Diabetes [name] will continue to work on weight loss, exercise, and decreasing simple carbohydrates in her diet to help decrease the risk of diabetes. [name] was given a refill on her metformin 500 mg BID with meals #60 with 0 refills and agrees to follow-up with our clinic in 2 weeks.  Vitamin D Deficiency [name] was informed that low Vitamin D levels contributes to fatigue and are associated with obesity, breast, and colon cancer. She agrees to continue to take prescription Vit D @ 50,000 IU every week #4 with 0 refills and will follow-up for routine testing of Vitamin D, at least 2-3 times per year. She was informed of the risk of over-replacement of Vitamin D and agrees to not increase her dose unless she discusses this with [name] first. [name] agrees to follow-up with our clinic in 2 weeks.  Obesity [name] is currently in the action stage of change. As such, her goal is to continue with weight loss efforts. She has agreed to follow the Category 3 plan. [name] has been instructed to continue her current exercise regimen for weight loss and overall health benefits. We discussed the following Behavioral Modification Strategies today: decreasing simple carbohydrates and keep a strict food journal.  [name] has agreed to follow-up with our clinic in 2 weeks. She was informed of the importance of frequent follow-up visits to maximize her success with intensive lifestyle modifications for her multiple health conditions.  ALLERGIES: No Known Allergies  MEDICATIONS: Current Outpatient Medications on File Prior to Visit  Medication Sig Dispense Refill  . acetaminophen (TYLENOL) 500 MG tablet Take 500 mg by mouth every 6 (six) hours as needed.    [name] amLODipine-olmesartan (AZOR) 10-40 MG per tablet Take 1 tablet by mouth daily.    [name] glucose blood test strip Use as instructed 100  each 0  . naproxen (NAPROSYN) 500 MG tablet Take 500 mg by mouth as needed.     . simvastatin (ZOCOR) 40 MG tablet Take 40 mg by mouth daily.    [name] spironolactone (ALDACTONE) 25 MG tablet Take 25 mg by mouth.     No current facility-administered medications on file prior to visit.     PAST MEDICAL HISTORY: Past Medical History:  Diagnosis Date  . Asthma   . Back pain   . Diabetes ([place])   . Hip pain   . Hyperlipidemia   . Hypertension   . Joint pain   . Knee pain   . Neuropathy   . Prediabetes     PAST SURGICAL HISTORY: Past Surgical History:  Procedure Laterality Date  . cyst removal     Right ankle 2016  . fibroid removal     2001-2003    SOCIAL HISTORY: Social History   Tobacco Use  . Smoking status: Never Smoker  . Smokeless tobacco: Never Used  Substance Use Topics  . Alcohol use: Not on file  . Drug use: Not on file    FAMILY HISTORY: Family History  Problem Relation Age of Onset  . Diabetes Mother   . Hyperlipidemia Mother   . Hypertension Mother   . Sleep apnea Mother   . Obesity Mother   . Heart disease Father    ROS: Review of Systems  Gastrointestinal: Negative for nausea and vomiting.  Musculoskeletal:       Negative for muscle weakness.  Endo/Heme/Allergies:       Negative for hypoglycemia. Positive for polyphagia.   PHYSICAL EXAM: Pt in no acute distress  RECENT LABS AND TESTS: BMET    Component Value Date/Time   NA 140 04/15/2018 0944   K 4.6 04/15/2018 0944   CL 103 04/15/2018 0944   CO2 18 (L) 04/15/2018 0944   GLUCOSE 85 04/15/2018 0944   BUN 11 04/15/2018 0944   CREATININE 0.72 04/15/2018 0944   CALCIUM 9.1 04/15/2018 0944   GFRNONAA 91 04/15/2018 0944   GFRAA 105 04/15/2018 0944   Lab Results  Component Value Date   HGBA1C 5.7 (H) 04/15/2018   Lab Results  Component Value Date   INSULIN 5.6 04/15/2018   CBC    Component Value Date/Time   WBC 3.6 04/15/2018 0944   RBC 4.88 04/15/2018 0944   HGB 13.9  04/15/2018 0944   HCT 41.6 04/15/2018 0944   MCV 85 04/15/2018 0944   MCH 28.5 04/15/2018 0944   MCHC 33.4 04/15/2018 0944   RDW 12.3 04/15/2018 0944   LYMPHSABS 1.7 04/15/2018 0944   EOSABS 0.1 04/15/2018 0944   BASOSABS 0.0 04/15/2018 0944   Iron/TIBC/Ferritin/ %Sat No results found for: IRON, TIBC, FERRITIN, IRONPCTSAT Lipid Panel     Component Value Date/Time   CHOL 164 04/15/2018 0944   TRIG 109 04/15/2018 0944   HDL 49 04/15/2018 0944   LDLCALC 93 04/15/2018 0944   Hepatic Function Panel     Component Value Date/Time   PROT 6.9 04/15/2018 0944   ALBUMIN 4.2 04/15/2018 0944   AST 22 04/15/2018  0944   ALT 12 04/15/2018 0944   ALKPHOS 63 04/15/2018 0944   BILITOT 0.4 04/15/2018 0944      Component Value Date/Time   TSH 1.380 04/15/2018 0944   Results for RUTHIE, BERCH (MRN [id number]) as of 08/28/2018 14:08  Ref. Range 04/15/2018 09:44  Vitamin D, 25-Hydroxy Latest Ref Range: 30.0 - 100.0 ng/mL 11.0 (L)    I, Michaelene Song, am acting as Location manager for [employer], FNP-C.  I have reviewed the above documentation for accuracy and completeness, and I agree with the above.  - Maezie Justin, FNP-C.

## 2018-09-09 ENCOUNTER — Encounter (INDEPENDENT_AMBULATORY_CARE_PROVIDER_SITE_OTHER): Payer: Self-pay | Admitting: Family Medicine

## 2018-09-09 ENCOUNTER — Ambulatory Visit (INDEPENDENT_AMBULATORY_CARE_PROVIDER_SITE_OTHER): Payer: BC Managed Care – PPO | Admitting: Family Medicine

## 2018-09-09 ENCOUNTER — Other Ambulatory Visit: Payer: Self-pay

## 2018-09-09 DIAGNOSIS — Z6841 Body Mass Index (BMI) 40.0 and over, adult: Secondary | ICD-10-CM

## 2018-09-09 DIAGNOSIS — R7303 Prediabetes: Secondary | ICD-10-CM | POA: Diagnosis not present

## 2018-09-09 NOTE — Progress Notes (Signed)
Office: [phone]  /  Fax: [phone] TeleHealth Visit:  [name] has verbally consented to this TeleHealth visit today. The patient is located at home, the provider is located at the [employer] [facility] office. The participants in this visit include the listed provider and patient. Delana was unable to use realtime audiovisual technology today and the telehealth visit was conducted via telephone (15 minute call).   HPI:   Chief Complaint: OBESITY [name] is here to discuss her progress with her obesity treatment plan. She is on the  follow the Category 3 plan and is following her eating plan approximately 50 % of the time. She states she is exercising by walking and doing ab exercises for 60 minutes daily.  [name]'s weight is stable around 258lb. 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 She sometimes eats out at dinner. She does not cook much. We were unable to weigh the patient today for this TeleHealth visit. She feels as if she has maintained weight since her last visit. She has lost 1 lbs since starting treatment with [name].  Pre-Diabetes [name] has a diagnosis of prediabetes based on her elevated HgA1c and was informed this puts her at greater risk of developing diabetes. She is taking metformin BID currently which helps with hunger and continues to work on diet and exercise to decrease risk of diabetes. She denies nausea or hypoglycemia.  ASSESSMENT AND PLAN:  Prediabetes  Class 3 severe obesity with serious comorbidity and body mass index (BMI) of 40.0 to 44.9 in adult, unspecified obesity type [facility])  PLAN: Pre-Diabetes [name] will continue to work on weight loss, exercise, and decreasing simple carbohydrates in her diet to help decrease the risk of diabetes.  [name] agrees to continue Metformin as prescribed. [name] agreed to follow up with [name] as directed to monitor her progress.  I spent > than 50% of  the 15 minute visit on counseling as documented in the note.  Obesity [name] is currently in the action stage of change. As such, her goal is to continue with weight loss efforts She has agreed to follow cat 3 and keep food journal at dinner (450-600 cal and 40+ grams of protein). Handouts via Mychart: Journaling, Eating Out, Protein contents of ffoods.  [name] has been instructed to continue exercise regismen for weight loss and overall health benefits. We discussed the following Behavioral Modification Stratagies today: work on meal planning and easy cooking plans, keeping a strict food journal, and planning for success.    [name] has agreed to follow up with our clinic in 2 weeks. She was informed of the importance of frequent follow up visits to maximize her success with intensive lifestyle modifications for her multiple health conditions.  ALLERGIES: No Known Allergies  MEDICATIONS: Current Outpatient Medications on File Prior to Visit  Medication Sig Dispense Refill  . acetaminophen (TYLENOL) 500 MG tablet Take 500 mg by mouth every 6 (six) hours as needed.    [name] amLODipine-olmesartan (AZOR) 10-40 MG per tablet Take 1 tablet by mouth daily.    [name] glucose blood test strip Use as instructed 100 each 0  . metFORMIN (GLUCOPHAGE) 500 MG tablet Take 1 tablet (500 mg total) by mouth 2 (two) times daily with a meal. 60 tablet 0  . naproxen (NAPROSYN) 500 MG tablet Take 500 mg by mouth as needed.     . simvastatin (ZOCOR) 40 MG tablet Take 40 mg by mouth daily.    [name]  spironolactone (ALDACTONE) 25 MG tablet Take 25 mg by mouth.    . Vitamin D, Ergocalciferol, (DRISDOL) 1.25 MG (50000 UT) CAPS capsule Take 1 capsule (50,000 Units total) by mouth every 7 (seven) days. 4 capsule 0   No current facility-administered medications on file prior to visit.     PAST MEDICAL HISTORY: Past Medical History:  Diagnosis Date  . Asthma   . Back pain   . Diabetes ([place])   . Hip pain   .  Hyperlipidemia   . Hypertension   . Joint pain   . Knee pain   . Neuropathy   . Prediabetes     PAST SURGICAL HISTORY: Past Surgical History:  Procedure Laterality Date  . cyst removal     Right ankle 2016  . fibroid removal     2001-2003    SOCIAL HISTORY: Social History   Tobacco Use  . Smoking status: Never Smoker  . Smokeless tobacco: Never Used  Substance Use Topics  . Alcohol use: Not on file  . Drug use: Not on file    FAMILY HISTORY: Family History  Problem Relation Age of Onset  . Diabetes Mother   . Hyperlipidemia Mother   . Hypertension Mother   . Sleep apnea Mother   . Obesity Mother   . Heart disease Father     ROS: Review of Systems  Gastrointestinal: Negative for nausea.  Endo/Heme/Allergies:       Negative for hypoglycemia    PHYSICAL EXAM: Pt in no acute distress  RECENT LABS AND TESTS: BMET    Component Value Date/Time   NA 140 04/15/2018 0944   K 4.6 04/15/2018 0944   CL 103 04/15/2018 0944   CO2 18 (L) 04/15/2018 0944   GLUCOSE 85 04/15/2018 0944   BUN 11 04/15/2018 0944   CREATININE 0.72 04/15/2018 0944   CALCIUM 9.1 04/15/2018 0944   GFRNONAA 91 04/15/2018 0944   GFRAA 105 04/15/2018 0944   Lab Results  Component Value Date   HGBA1C 5.7 (H) 04/15/2018   Lab Results  Component Value Date   INSULIN 5.6 04/15/2018   CBC    Component Value Date/Time   WBC 3.6 04/15/2018 0944   RBC 4.88 04/15/2018 0944   HGB 13.9 04/15/2018 0944   HCT 41.6 04/15/2018 0944   MCV 85 04/15/2018 0944   MCH 28.5 04/15/2018 0944   MCHC 33.4 04/15/2018 0944   RDW 12.3 04/15/2018 0944   LYMPHSABS 1.7 04/15/2018 0944   EOSABS 0.1 04/15/2018 0944   BASOSABS 0.0 04/15/2018 0944   Iron/TIBC/Ferritin/ %Sat No results found for: IRON, TIBC, FERRITIN, IRONPCTSAT Lipid Panel     Component Value Date/Time   CHOL 164 04/15/2018 0944   TRIG 109 04/15/2018 0944   HDL 49 04/15/2018 0944   LDLCALC 93 04/15/2018 0944   Hepatic Function Panel      Component Value Date/Time   PROT 6.9 04/15/2018 0944   ALBUMIN 4.2 04/15/2018 0944   AST 22 04/15/2018 0944   ALT 12 04/15/2018 0944   ALKPHOS 63 04/15/2018 0944   BILITOT 0.4 04/15/2018 0944      Component Value Date/Time   TSH 1.380 04/15/2018 0944      I, Renee Ramus, am acting as Location manager for [employer], FNP-C.  I have reviewed the above documentation for accuracy and completeness, and I agree with the above.  - Neylan Koroma, FNP-C.

## 2018-09-21 ENCOUNTER — Other Ambulatory Visit (INDEPENDENT_AMBULATORY_CARE_PROVIDER_SITE_OTHER): Payer: Self-pay | Admitting: Family Medicine

## 2018-09-21 DIAGNOSIS — E559 Vitamin D deficiency, unspecified: Secondary | ICD-10-CM

## 2018-09-22 ENCOUNTER — Encounter (INDEPENDENT_AMBULATORY_CARE_PROVIDER_SITE_OTHER): Payer: Self-pay | Admitting: Family Medicine

## 2018-09-23 ENCOUNTER — Other Ambulatory Visit (INDEPENDENT_AMBULATORY_CARE_PROVIDER_SITE_OTHER): Payer: Self-pay | Admitting: Family Medicine

## 2018-09-23 ENCOUNTER — Encounter (INDEPENDENT_AMBULATORY_CARE_PROVIDER_SITE_OTHER): Payer: Self-pay | Admitting: Family Medicine

## 2018-09-23 ENCOUNTER — Telehealth (INDEPENDENT_AMBULATORY_CARE_PROVIDER_SITE_OTHER): Payer: BC Managed Care – PPO | Admitting: Family Medicine

## 2018-09-23 ENCOUNTER — Other Ambulatory Visit: Payer: Self-pay

## 2018-09-23 DIAGNOSIS — R7303 Prediabetes: Secondary | ICD-10-CM

## 2018-09-23 DIAGNOSIS — Z6841 Body Mass Index (BMI) 40.0 and over, adult: Secondary | ICD-10-CM | POA: Diagnosis not present

## 2018-09-23 DIAGNOSIS — E559 Vitamin D deficiency, unspecified: Secondary | ICD-10-CM | POA: Diagnosis not present

## 2018-09-23 MED ORDER — VITAMIN D (ERGOCALCIFEROL) 1.25 MG (50000 UNIT) PO CAPS: 50000.0000 [IU] | ORAL_CAPSULE | ORAL | 0 refills | Status: DC

## 2018-09-23 NOTE — Progress Notes (Signed)
Office: [phone]  /  Fax: [phone] TeleHealth Visit:  [name] has verbally consented to this TeleHealth visit today. The patient is located at home, the provider is located at the [employer] [facility] office. The participants in this visit include the listed provider and patient. Jazmon was unable to use realtime audiovisual technology today and the telehealth visit was conducted via audio only (20 minute call).   HPI:   Chief Complaint: OBESITY [name] is here to discuss her progress with her obesity treatment plan. She is on the keep a food journal with 450-600 calories and 40+ grams of protein at supper daily and follow the Category 3 plan and is following her eating plan approximately 60 % of the time. She states she is walking for 15 minutes 4 times per week. [name]'s weight is maintained at 258 lbs. She struggles with food choices because she does not always shop regularly. She states her blood pressure is 129/68. We were unable to weigh the patient today for this TeleHealth visit. She feels as if she has maintained her weight since her last visit. She has lost 1 lb since starting treatment with [name].  Vitamin D Deficiency [name] has a diagnosis of vitamin D deficiency. She is currently taking prescription Vit D, but level is not at goal. Last Vit D level was 11 on 04/17/2018. She denies nausea, vomiting or muscle weakness.  Pre-Diabetes [name] has a diagnosis of pre-diabetes based on her elevated Hgb A1c and was informed this puts her at greater risk of developing diabetes. She has not been taking her 2nd dose of metformin due to concerns of recall and hypoglycemia. continues to work on diet and exercise to decrease risk of diabetes. Lab Results  Component Value Date   HGBA1C 5.7 (H) 04/15/2018    ASSESSMENT AND PLAN:  Prediabetes - Plan: Hemoglobin A1c, Insulin, Free and Total, Comprehensive metabolic panel  Vitamin D deficiency - Plan: VITAMIN D 25  Hydroxy (Vit-D Deficiency, Fractures), Vitamin D, Ergocalciferol, (DRISDOL) 1.25 MG (50000 UT) CAPS capsule  PLAN:  Vitamin D Deficiency [name] was informed that low vitamin D levels contributes to fatigue and are associated with obesity, breast, and colon cancer. [name] agrees to continue taking prescription Vit D 50,000 IU every week #4 and we will refill for 1 month. She will follow up for routine testing of vitamin D, at least 2-3 times per year. She was informed of the risk of over-replacement of vitamin D and agrees to not increase her dose unless she discusses this with [name] first. Vit D level was ordered today. [name] agrees to follow up with our clinic in 2 weeks.  Pre-Diabetes [name] will continue to work on weight loss, exercise, and decreasing simple carbohydrates in her diet to help decrease the risk of diabetes.  [name] agrees to continue taking metformin BID, and she was advised that IR metformin has not been recalled and it won't cause hypoglycemia. Fasting insulin, fasting glucose, and A1c was ordered today. [name] agrees to follow up with our clinic in 2 weeks as directed to monitor her progress.  Obesity [name] is currently in the action stage of change. As such, her goal is to continue with weight loss efforts She has agreed to keep a food journal with 450-600 calories and 40+ grams of protein at supper daily and follow the Category 3 plan [name] has been instructed to work up to a goal of 150 minutes of combined cardio and strengthening exercise per week or continue  current regimen for weight loss and overall health benefits. We discussed the following Behavioral Modification Strategies today: keeping healthy foods in the home and planning for success [name] will shop for groceries more regularly and keep healthy foods available. She will go to [facility] to have her labs done.  [name] has agreed to follow up with our clinic in 2 weeks. She was informed of the importance  of frequent follow up visits to maximize her success with intensive lifestyle modifications for her multiple health conditions.  ALLERGIES: No Known Allergies  MEDICATIONS: Current Outpatient Medications on File Prior to Visit  Medication Sig Dispense Refill  . acetaminophen (TYLENOL) 500 MG tablet Take 500 mg by mouth every 6 (six) hours as needed.    [name] amLODipine-olmesartan (AZOR) 10-40 MG per tablet Take 1 tablet by mouth daily.    [name] glucose blood test strip Use as instructed 100 each 0  . metFORMIN (GLUCOPHAGE) 500 MG tablet Take 1 tablet (500 mg total) by mouth 2 (two) times daily with a meal. 60 tablet 0  . naproxen (NAPROSYN) 500 MG tablet Take 500 mg by mouth as needed.     . simvastatin (ZOCOR) 40 MG tablet Take 40 mg by mouth daily.    [name] spironolactone (ALDACTONE) 25 MG tablet Take 25 mg by mouth.     No current facility-administered medications on file prior to visit.     PAST MEDICAL HISTORY: Past Medical History:  Diagnosis Date  . Asthma   . Back pain   . Diabetes ([place])   . Hip pain   . Hyperlipidemia   . Hypertension   . Joint pain   . Knee pain   . Neuropathy   . Prediabetes     PAST SURGICAL HISTORY: Past Surgical History:  Procedure Laterality Date  . cyst removal     Right ankle 2016  . fibroid removal     2001-2003    SOCIAL HISTORY: Social History   Tobacco Use  . Smoking status: Never Smoker  . Smokeless tobacco: Never Used  Substance Use Topics  . Alcohol use: Not on file  . Drug use: Not on file    FAMILY HISTORY: Family History  Problem Relation Age of Onset  . Diabetes Mother   . Hyperlipidemia Mother   . Hypertension Mother   . Sleep apnea Mother   . Obesity Mother   . Heart disease Father     ROS: Review of Systems  Constitutional: Negative for weight loss.  Gastrointestinal: Negative for nausea and vomiting.  Musculoskeletal:       Negative muscle weakness    PHYSICAL EXAM: Pt in no acute distress  RECENT LABS  AND TESTS: BMET    Component Value Date/Time   NA 140 04/15/2018 0944   K 4.6 04/15/2018 0944   CL 103 04/15/2018 0944   CO2 18 (L) 04/15/2018 0944   GLUCOSE 85 04/15/2018 0944   BUN 11 04/15/2018 0944   CREATININE 0.72 04/15/2018 0944   CALCIUM 9.1 04/15/2018 0944   GFRNONAA 91 04/15/2018 0944   GFRAA 105 04/15/2018 0944   Lab Results  Component Value Date   HGBA1C 5.7 (H) 04/15/2018   Lab Results  Component Value Date   INSULIN 5.6 04/15/2018   CBC    Component Value Date/Time   WBC 3.6 04/15/2018 0944   RBC 4.88 04/15/2018 0944   HGB 13.9 04/15/2018 0944   HCT 41.6 04/15/2018 0944   MCV 85 04/15/2018 0944   MCH 28.5 04/15/2018  0944   MCHC 33.4 04/15/2018 0944   RDW 12.3 04/15/2018 0944   LYMPHSABS 1.7 04/15/2018 0944   EOSABS 0.1 04/15/2018 0944   BASOSABS 0.0 04/15/2018 0944   Iron/TIBC/Ferritin/ %Sat No results found for: IRON, TIBC, FERRITIN, IRONPCTSAT Lipid Panel     Component Value Date/Time   CHOL 164 04/15/2018 0944   TRIG 109 04/15/2018 0944   HDL 49 04/15/2018 0944   LDLCALC 93 04/15/2018 0944   Hepatic Function Panel     Component Value Date/Time   PROT 6.9 04/15/2018 0944   ALBUMIN 4.2 04/15/2018 0944   AST 22 04/15/2018 0944   ALT 12 04/15/2018 0944   ALKPHOS 63 04/15/2018 0944   BILITOT 0.4 04/15/2018 0944      Component Value Date/Time   TSH 1.380 04/15/2018 0944      I, Trixie Dredge, am acting as Location manager for [employer], FNP-C.  I have reviewed the above documentation for accuracy and completeness, and I agree with the above.  - Manju Kulkarni, FNP-C.

## 2018-10-09 ENCOUNTER — Telehealth (INDEPENDENT_AMBULATORY_CARE_PROVIDER_SITE_OTHER): Payer: BC Managed Care – PPO | Admitting: Family Medicine

## 2018-10-20 ENCOUNTER — Other Ambulatory Visit (INDEPENDENT_AMBULATORY_CARE_PROVIDER_SITE_OTHER): Payer: Self-pay | Admitting: Family Medicine

## 2018-10-20 DIAGNOSIS — E559 Vitamin D deficiency, unspecified: Secondary | ICD-10-CM

## 2018-10-21 ENCOUNTER — Telehealth (INDEPENDENT_AMBULATORY_CARE_PROVIDER_SITE_OTHER): Payer: Self-pay | Admitting: Family Medicine

## 2018-10-21 NOTE — Telephone Encounter (Signed)
Pt informed medications will be refilled at appt on 7/23. Renee Ramus

## 2018-10-21 NOTE — Telephone Encounter (Signed)
Patient states her refill for Metformin & Vit D was denied.  She cancelled her 7/9 appt due to finances but has now rescheduled to 7/23.  She is completely out of the Vit D. Pharmacy: [facility], [place], [facility]

## 2018-10-23 ENCOUNTER — Other Ambulatory Visit: Payer: Self-pay

## 2018-10-23 ENCOUNTER — Encounter (INDEPENDENT_AMBULATORY_CARE_PROVIDER_SITE_OTHER): Payer: Self-pay

## 2018-10-23 ENCOUNTER — Encounter (INDEPENDENT_AMBULATORY_CARE_PROVIDER_SITE_OTHER): Payer: Self-pay | Admitting: Family Medicine

## 2018-10-23 ENCOUNTER — Telehealth (INDEPENDENT_AMBULATORY_CARE_PROVIDER_SITE_OTHER): Payer: BC Managed Care – PPO | Admitting: Family Medicine

## 2018-10-23 DIAGNOSIS — R7303 Prediabetes: Secondary | ICD-10-CM

## 2018-10-23 DIAGNOSIS — Z6841 Body Mass Index (BMI) 40.0 and over, adult: Secondary | ICD-10-CM

## 2018-10-23 DIAGNOSIS — E559 Vitamin D deficiency, unspecified: Secondary | ICD-10-CM

## 2018-10-23 MED ORDER — METFORMIN HCL 500 MG PO TABS: 500.0000 mg | ORAL_TABLET | Freq: Two times a day (BID) | ORAL | 0 refills | Status: DC

## 2018-10-23 MED ORDER — VITAMIN D (ERGOCALCIFEROL) 1.25 MG (50000 UNIT) PO CAPS: 50000.0000 [IU] | ORAL_CAPSULE | ORAL | 0 refills | Status: DC

## 2018-10-27 ENCOUNTER — Other Ambulatory Visit (INDEPENDENT_AMBULATORY_CARE_PROVIDER_SITE_OTHER): Payer: Self-pay | Admitting: Family Medicine

## 2018-10-27 ENCOUNTER — Other Ambulatory Visit (INDEPENDENT_AMBULATORY_CARE_PROVIDER_SITE_OTHER): Payer: Self-pay

## 2018-10-27 DIAGNOSIS — E559 Vitamin D deficiency, unspecified: Secondary | ICD-10-CM

## 2018-10-27 DIAGNOSIS — R7303 Prediabetes: Secondary | ICD-10-CM

## 2018-10-27 MED ORDER — VITAMIN D (ERGOCALCIFEROL) 1.25 MG (50000 UNIT) PO CAPS: 50000.0000 [IU] | ORAL_CAPSULE | ORAL | 0 refills | Status: DC

## 2018-10-27 MED ORDER — METFORMIN HCL 500 MG PO TABS: 500.0000 mg | ORAL_TABLET | Freq: Two times a day (BID) | ORAL | 0 refills | Status: DC

## 2018-10-29 NOTE — Progress Notes (Signed)
Office: [phone]  /  Fax: [phone] TeleHealth Visit:  [name] has verbally consented to this TeleHealth visit today. The patient is located at home, the provider is located at the [employer] [facility] office. The participants in this visit include the listed provider and patient and [name] and all parties involved. The visit was conducted today via telephone. [name] was unable to use realtime audiovisual technology today and the telehealth visit was conducted via telephone.  HPI:   Chief Complaint: OBESITY [name] is here to discuss her progress with her obesity treatment plan. She is on the Category 3 plan and is following her eating plan approximately 50 % of the time. She states she is walking and exercising with  [employer] on [employer] 60 minutes 4 days per week. [name] states her weight today was 255 pounds, and she has lost another 3 pounds since her last visit. [name] still struggles with stress eating at times, but she feels she is doing better. [name] will be traveling soon and she would like to discuss eating out strategies. We were unable to weigh the patient today for this TeleHealth visit. She feels as if she has lost weight since her last visit. She has lost 1 lb since starting treatment with [name].  Pre-Diabetes [name] has a diagnosis of prediabetes based on her elevated Hgb A1c and was informed this puts her at greater risk of developing diabetes. [name] is stable on metformin, but she didn't get her labs yet. [name] is doing well with diet and weight loss. She continues to work on diet and exercise to decrease risk of diabetes. She denies nausea, vomiting or hypoglycemia.  Vitamin D deficiency [name] has a diagnosis of vitamin D deficiency. She is stable on vit D and she is due for labs. [name] denies nausea, vomiting or muscle weakness.  ASSESSMENT AND PLAN:  Vitamin D deficiency - Plan: DISCONTINUED: Vitamin D, Ergocalciferol, (DRISDOL) 1.25 MG (50000  UT) CAPS capsule  Prediabetes - Plan: DISCONTINUED: metFORMIN (GLUCOPHAGE) 500 MG tablet  Class 3 severe obesity with serious comorbidity and body mass index (BMI) of 40.0 to 44.9 in adult, unspecified obesity type [facility])  PLAN:  Pre-Diabetes [name] will continue to work on weight loss, exercise, and decreasing simple carbohydrates in her diet to help decrease the risk of diabetes. We dicussed metformin including benefits and risks. She was informed that eating too many simple carbohydrates or too many calories at one sitting increases the likelihood of GI side effects. [name] agrees to continue metformin 500 mg two times daily with a meal #60 with no refills and follow up with [name] as directed to monitor her progress. [name] will get labs done as ordered.  Vitamin D Deficiency [name] was informed that low vitamin D levels contributes to fatigue and are associated with obesity, breast, and colon cancer. She agrees to continue to take prescription Vit D @50 ,000 IU every week #4 with no refills and will follow up for routine testing of vitamin D, at least 2-3 times per year. She was informed of the risk of over-replacement of vitamin D and agrees to not increase her dose unless she discusses this with [name] first. [name] will get labs done as ordered and she agrees to follow up as directed.  Obesity [name] is currently in the action stage of change. As such, her goal is to continue with weight loss efforts She has agreed to follow the Category 3 plan [name] has been instructed to work up to a goal  of 150 minutes of combined cardio and strengthening exercise per week for weight loss and overall health benefits. We discussed the following Behavioral Modification Strategies today: decreasing simple carbohydrates , work on meal planning and easy cooking plans, travel eating strategies, celebration eating strategies and emotional eating strategies  [name] has agreed to follow up with our clinic in 3  weeks. She was informed of the importance of frequent follow up visits to maximize her success with intensive lifestyle modifications for her multiple health conditions.  ALLERGIES: No Known Allergies  MEDICATIONS: Current Outpatient Medications on File Prior to Visit  Medication Sig Dispense Refill  . acetaminophen (TYLENOL) 500 MG tablet Take 500 mg by mouth every 6 (six) hours as needed.    [name] amLODipine-olmesartan (AZOR) 10-40 MG per tablet Take 1 tablet by mouth daily.    [name] glucose blood test strip Use as instructed 100 each 0  . naproxen (NAPROSYN) 500 MG tablet Take 500 mg by mouth as needed.     . simvastatin (ZOCOR) 40 MG tablet Take 40 mg by mouth daily.    [name] spironolactone (ALDACTONE) 25 MG tablet Take 25 mg by mouth.     No current facility-administered medications on file prior to visit.     PAST MEDICAL HISTORY: Past Medical History:  Diagnosis Date  . Asthma   . Back pain   . Diabetes ([place])   . Hip pain   . Hyperlipidemia   . Hypertension   . Joint pain   . Knee pain   . Neuropathy   . Prediabetes     PAST SURGICAL HISTORY: Past Surgical History:  Procedure Laterality Date  . cyst removal     Right ankle 2016  . fibroid removal     2001-2003    SOCIAL HISTORY: Social History   Tobacco Use  . Smoking status: Never Smoker  . Smokeless tobacco: Never Used  Substance Use Topics  . Alcohol use: Not on file  . Drug use: Not on file    FAMILY HISTORY: Family History  Problem Relation Age of Onset  . Diabetes Mother   . Hyperlipidemia Mother   . Hypertension Mother   . Sleep apnea Mother   . Obesity Mother   . Heart disease Father     ROS: Review of Systems  Constitutional: Positive for weight loss.  Gastrointestinal: Negative for nausea and vomiting.  Musculoskeletal:       Negative for muscle weakness  Endo/Heme/Allergies:       Negative for hypoglycemia    PHYSICAL EXAM: Pt in no acute distress  RECENT LABS AND TESTS: BMET     Component Value Date/Time   NA 140 04/15/2018 0944   K 4.6 04/15/2018 0944   CL 103 04/15/2018 0944   CO2 18 (L) 04/15/2018 0944   GLUCOSE 85 04/15/2018 0944   BUN 11 04/15/2018 0944   CREATININE 0.72 04/15/2018 0944   CALCIUM 9.1 04/15/2018 0944   GFRNONAA 91 04/15/2018 0944   GFRAA 105 04/15/2018 0944   Lab Results  Component Value Date   HGBA1C 5.7 (H) 04/15/2018   Lab Results  Component Value Date   INSULIN 5.6 04/15/2018   CBC    Component Value Date/Time   WBC 3.6 04/15/2018 0944   RBC 4.88 04/15/2018 0944   HGB 13.9 04/15/2018 0944   HCT 41.6 04/15/2018 0944   MCV 85 04/15/2018 0944   MCH 28.5 04/15/2018 0944   MCHC 33.4 04/15/2018 0944   RDW 12.3 04/15/2018 0944  LYMPHSABS 1.7 04/15/2018 0944   EOSABS 0.1 04/15/2018 0944   BASOSABS 0.0 04/15/2018 0944   Iron/TIBC/Ferritin/ %Sat No results found for: IRON, TIBC, FERRITIN, IRONPCTSAT Lipid Panel     Component Value Date/Time   CHOL 164 04/15/2018 0944   TRIG 109 04/15/2018 0944   HDL 49 04/15/2018 0944   LDLCALC 93 04/15/2018 0944   Hepatic Function Panel     Component Value Date/Time   PROT 6.9 04/15/2018 0944   ALBUMIN 4.2 04/15/2018 0944   AST 22 04/15/2018 0944   ALT 12 04/15/2018 0944   ALKPHOS 63 04/15/2018 0944   BILITOT 0.4 04/15/2018 0944      Component Value Date/Time   TSH 1.380 04/15/2018 0944     Ref. Range 04/15/2018 09:44  Vitamin D, 25-Hydroxy Latest Ref Range: 30.0 - 100.0 ng/mL 11.0 (L)    I, Doreene Nest, am acting as transcriptionist for Dennard Nip, MD I have reviewed the above documentation for accuracy and completeness, and I agree with the above. -Dennard Nip, MD

## 2018-11-04 LAB — COMPREHENSIVE METABOLIC PANEL
ALT: 31 IU/L (ref 0–32)
AST: 30 IU/L (ref 0–40)
Albumin/Globulin Ratio: 2 (ref 1.2–2.2)
Albumin: 4.7 g/dL (ref 3.8–4.8)
Alkaline Phosphatase: 58 IU/L (ref 39–117)
BUN/Creatinine Ratio: 15 (ref 12–28)
BUN: 11 mg/dL (ref 8–27)
Bilirubin Total: 0.5 mg/dL (ref 0.0–1.2)
CO2: 24 mmol/L (ref 20–29)
Calcium: 9.7 mg/dL (ref 8.7–10.3)
Chloride: 103 mmol/L (ref 96–106)
Creatinine, Ser: 0.72 mg/dL (ref 0.57–1.00)
GFR calc Af Amer: 104 mL/min/{1.73_m2} (ref >59)
GFR calc non Af Amer: 90 mL/min/{1.73_m2} (ref >59)
Globulin, Total: 2.4 g/dL (ref 1.5–4.5)
Glucose: 91 mg/dL (ref 65–99)
Potassium: 4.4 mmol/L (ref 3.5–5.2)
Sodium: 141 mmol/L (ref 134–144)
Total Protein: 7.1 g/dL (ref 6.0–8.5)

## 2018-11-04 LAB — HEMOGLOBIN A1C
Est. average glucose Bld gHb Est-mCnc: 111 mg/dL
Hgb A1c MFr Bld: 5.5 % (ref 4.8–5.6)

## 2018-11-04 LAB — INSULIN, FREE AND TOTAL
Free Insulin: 23 uU/mL — ABNORMAL HIGH
Total Insulin: 23 uU/mL

## 2018-11-04 LAB — VITAMIN D 25 HYDROXY (VIT D DEFICIENCY, FRACTURES): Vit D, 25-Hydroxy: 50.4 ng/mL (ref 30.0–100.0)

## 2018-11-12 ENCOUNTER — Encounter (INDEPENDENT_AMBULATORY_CARE_PROVIDER_SITE_OTHER): Payer: Self-pay | Admitting: Family Medicine

## 2018-11-12 ENCOUNTER — Other Ambulatory Visit: Payer: Self-pay

## 2018-11-12 ENCOUNTER — Telehealth (INDEPENDENT_AMBULATORY_CARE_PROVIDER_SITE_OTHER): Payer: BC Managed Care – PPO | Admitting: Family Medicine

## 2018-11-12 DIAGNOSIS — E559 Vitamin D deficiency, unspecified: Secondary | ICD-10-CM | POA: Diagnosis not present

## 2018-11-12 DIAGNOSIS — Z6841 Body Mass Index (BMI) 40.0 and over, adult: Secondary | ICD-10-CM | POA: Diagnosis not present

## 2018-11-12 MED ORDER — VITAMIN D (ERGOCALCIFEROL) 1.25 MG (50000 UNIT) PO CAPS: 50000.0000 [IU] | ORAL_CAPSULE | ORAL | 0 refills | Status: DC

## 2018-11-17 NOTE — Progress Notes (Signed)
Office: [phone]  /  Fax: [phone] TeleHealth Visit:  [name] has verbally consented to this TeleHealth visit today. The patient is located at work, the provider is located at the [employer] [facility] office. The participants in this visit include the listed provider and patient. The visit was conducted today via telephone call (Doxy failed - changed to telephone call).  HPI:   Chief Complaint: OBESITY [name] is here to discuss her progress with her obesity treatment plan. She is on the Category 3 plan and is following her eating plan approximately 45% of the time. She states she is walking 15 minutes 4 times per week. [name] continues to work on [employer] loss and thinks she has lost another pound since her last visit. She is deviating from her plan more due to decreased meal planning and boredom with dinner. She would like to discuss more healthy meal ideas. We were unable to weigh the patient today for this TeleHealth visit. She feels as if she has lost 1 pound since her last visit. She has lost 1 lb since starting treatment with [name].  Vitamin D deficiency [name] has a diagnosis of Vitamin D deficiency, which is now at goal (50.4 on 10/31/2018). She is currently taking prescription Vit D and denies nausea, vomiting or muscle weakness. Fatigue is improved.  ASSESSMENT AND PLAN:  Vitamin D deficiency - Plan: Vitamin D, Ergocalciferol, (DRISDOL) 1.25 MG (50000 UT) CAPS capsule  Class 3 severe obesity with serious comorbidity and body mass index (BMI) of 40.0 to 44.9 in adult, unspecified obesity type ([facility])  PLAN:  Vitamin D Deficiency [name] was informed that low Vitamin D levels contributes to fatigue and are associated with obesity, breast, and colon cancer. She agrees to continue to take prescription Vit D @ 50,000 IU every week #4 with 0 refills and will follow-up for routine testing of Vitamin D, at least 2-3 times per year. She was informed of the risk of  over-replacement of Vitamin D and agrees to not increase her dose unless she discusses this with [name] first. [name] agrees to follow-up with our clinic in 2-3 weeks.  I spent > than 50% of the 25 minute visit on counseling as documented in the note.  Obesity [name] is currently in the action stage of change. As such, her goal is to continue with weight loss efforts. She has agreed to follow the Category 3 plan. We discussed ways to prepare lean protein and vegetables which are more interesting but still low calorie. [name] has been instructed to work up to a goal of 150 minutes of combined cardio and strengthening exercise per week for weight loss and overall health benefits. We discussed the following Behavioral Modification Strategies today: work on meal planning and easy cooking plans, better snacking choices, and planning for success.  [name] has agreed to follow-up with our clinic in 2-3 weeks. She was informed of the importance of frequent follow-up visits to maximize her success with intensive lifestyle modifications for her multiple health conditions.  ALLERGIES: No Known Allergies  MEDICATIONS: Current Outpatient Medications on File Prior to Visit  Medication Sig Dispense Refill   acetaminophen (TYLENOL) 500 MG tablet Take 500 mg by mouth every 6 (six) hours as needed.     amLODipine-olmesartan (AZOR) 10-40 MG per tablet Take 1 tablet by mouth daily.     glucose blood test strip Use as instructed 100 each 0   metFORMIN (GLUCOPHAGE) 500 MG tablet Take 1 tablet (500 mg total) by  mouth 2 (two) times daily with a meal. 60 tablet 0   naproxen (NAPROSYN) 500 MG tablet Take 500 mg by mouth as needed.      simvastatin (ZOCOR) 40 MG tablet Take 40 mg by mouth daily.     spironolactone (ALDACTONE) 25 MG tablet Take 25 mg by mouth.     No current facility-administered medications on file prior to visit.     PAST MEDICAL HISTORY: Past Medical History:  Diagnosis Date   Asthma     Back pain    Diabetes ([facility])    Hip pain    Hyperlipidemia    Hypertension    Joint pain    Knee pain    Neuropathy    Prediabetes     PAST SURGICAL HISTORY: Past Surgical History:  Procedure Laterality Date   cyst removal     Right ankle 2016   fibroid removal     2001-2003    SOCIAL HISTORY: Social History   Tobacco Use   Smoking status: Never Smoker   Smokeless tobacco: Never Used  Substance Use Topics   Alcohol use: Not on file   Drug use: Not on file    FAMILY HISTORY: Family History  Problem Relation Age of Onset   Diabetes Mother    Hyperlipidemia Mother    Hypertension Mother    Sleep apnea Mother    Obesity Mother    Heart disease Father    ROS: Review of Systems  Constitutional: Negative for malaise/fatigue (improved).  Gastrointestinal: Negative for nausea and vomiting.  Musculoskeletal:       Negative for muscle weakness.   PHYSICAL EXAM: Pt in no acute distress  RECENT LABS AND TESTS: BMET    Component Value Date/Time   NA 141 10/31/2018 0835   K 4.4 10/31/2018 0835   CL 103 10/31/2018 0835   CO2 24 10/31/2018 0835   GLUCOSE 91 10/31/2018 0835   BUN 11 10/31/2018 0835   CREATININE 0.72 10/31/2018 0835   CALCIUM 9.7 10/31/2018 0835   GFRNONAA 90 10/31/2018 0835   GFRAA 104 10/31/2018 0835   Lab Results  Component Value Date   HGBA1C 5.5 10/31/2018   HGBA1C 5.7 (H) 04/15/2018   Lab Results  Component Value Date   INSULIN 5.6 04/15/2018   CBC    Component Value Date/Time   WBC 3.6 04/15/2018 0944   RBC 4.88 04/15/2018 0944   HGB 13.9 04/15/2018 0944   HCT 41.6 04/15/2018 0944   MCV 85 04/15/2018 0944   MCH 28.5 04/15/2018 0944   MCHC 33.4 04/15/2018 0944   RDW 12.3 04/15/2018 0944   LYMPHSABS 1.7 04/15/2018 0944   EOSABS 0.1 04/15/2018 0944   BASOSABS 0.0 04/15/2018 0944   Iron/TIBC/Ferritin/ %Sat No results found for: IRON, TIBC, FERRITIN, IRONPCTSAT Lipid Panel     Component Value  Date/Time   CHOL 164 04/15/2018 0944   TRIG 109 04/15/2018 0944   HDL 49 04/15/2018 0944   LDLCALC 93 04/15/2018 0944   Hepatic Function Panel     Component Value Date/Time   PROT 7.1 10/31/2018 0835   ALBUMIN 4.7 10/31/2018 0835   AST 30 10/31/2018 0835   ALT 31 10/31/2018 0835   ALKPHOS 58 10/31/2018 0835   BILITOT 0.5 10/31/2018 0835      Component Value Date/Time   TSH 1.380 04/15/2018 0944   Results for Wyka, FIORELLA HANAHAN (MRN [id number]) as of 11/17/2018 16:47  Ref. Range 10/31/2018 08:35  Vitamin D, 25-Hydroxy Latest Ref Range: 30.0 -  100.0 ng/mL 50.4   I, Michaelene Song, am acting as Location manager for Dennard Nip, MD I have reviewed the above documentation for accuracy and completeness, and I agree with the above. -Dennard Nip, MD

## 2018-11-22 ENCOUNTER — Other Ambulatory Visit (INDEPENDENT_AMBULATORY_CARE_PROVIDER_SITE_OTHER): Payer: Self-pay | Admitting: Family Medicine

## 2018-11-22 DIAGNOSIS — R7303 Prediabetes: Secondary | ICD-10-CM

## 2018-11-26 ENCOUNTER — Telehealth (INDEPENDENT_AMBULATORY_CARE_PROVIDER_SITE_OTHER): Payer: BC Managed Care – PPO | Admitting: Family Medicine

## 2018-11-26 ENCOUNTER — Encounter (INDEPENDENT_AMBULATORY_CARE_PROVIDER_SITE_OTHER): Payer: Self-pay | Admitting: Family Medicine

## 2018-11-26 ENCOUNTER — Other Ambulatory Visit: Payer: Self-pay

## 2018-11-26 DIAGNOSIS — R7303 Prediabetes: Secondary | ICD-10-CM | POA: Diagnosis not present

## 2018-11-26 DIAGNOSIS — E559 Vitamin D deficiency, unspecified: Secondary | ICD-10-CM

## 2018-11-26 DIAGNOSIS — Z6841 Body Mass Index (BMI) 40.0 and over, adult: Secondary | ICD-10-CM | POA: Diagnosis not present

## 2018-11-26 MED ORDER — VITAMIN D (ERGOCALCIFEROL) 1.25 MG (50000 UNIT) PO CAPS: 50000.0000 [IU] | ORAL_CAPSULE | ORAL | 0 refills | Status: DC

## 2018-11-26 MED ORDER — METFORMIN HCL 500 MG PO TABS: 500.0000 mg | ORAL_TABLET | Freq: Two times a day (BID) | ORAL | 0 refills | Status: DC

## 2018-11-27 NOTE — Progress Notes (Addendum)
Office: [phone]  /  Fax: [phone] TeleHealth Visit:  [name] has verbally consented to this TeleHealth visit today. The patient is located at home, the provider is located at the [employer] [facility] office. The participants in this visit include the listed provider and patient. [name] was unable to use realtime audiovisual technology today and the telehealth visit was conducted via telephone.   HPI:   Chief Complaint: OBESITY [name] is here to discuss her progress with her obesity treatment plan. She is on the Category 3 plan and is following her eating plan approximately 50 % of the time. She states she is walking for 15 minutes 3 times per week. [name] feels she has done well maintaining her weight. She had labs done previously and would like to discuss the results. She notes she is struggling with meal planning, especially at dinner more since our last visit. She states her blood pressure was 125/83 this morning. We were unable to weigh the patient today for this TeleHealth visit. She feels as if she has maintained her weight since her last visit. She has lost 1 lb since starting treatment with [name].  Pre-Diabetes [name] has a diagnosis of pre-diabetes based on her elevated Hgb A1c and was informed this puts her at greater risk of developing diabetes. Her A1c has improved to 5.5 and is now at goal with diet and metformin. She continues to work on diet and exercise to decrease risk of diabetes. She denies nausea, vomiting, or hypoglycemia.  Vitamin D Deficiency [name] has a diagnosis of vitamin D deficiency. She is currently taking prescription Vit D. Level is now at goal and she notes fatigue has improved. She denies nausea, vomiting or muscle weakness.  ASSESSMENT AND PLAN:  Class 3 severe obesity with serious comorbidity and body mass index (BMI) of 40.0 to 44.9 in adult, unspecified obesity type ([place])  Prediabetes - Plan: metFORMIN (GLUCOPHAGE) 500 MG tablet   Vitamin D deficiency - Plan: Vitamin D, Ergocalciferol, (DRISDOL) 1.25 MG (50000 UT) CAPS capsule  PLAN:  Pre-Diabetes [name] will continue her diet, and will continue to work on weight loss, exercise, and decreasing simple carbohydrates in her diet to help decrease the risk of diabetes. We dicussed metformin including benefits and risks. She was informed that eating too many simple carbohydrates or too many calories at one sitting increases the likelihood of GI side effects. Keilana agrees to continue taking metformin 500 mg BID #60 and we will refill for 1 month. Dezeray agrees to follow up with our clinic in 3 weeks as directed to monitor her progress.  Vitamin D Deficiency [name] was informed that low vitamin D levels contributes to fatigue and are associated with obesity, breast, and colon cancer. [name] agrees to continue taking prescription Vit D 50,000 IU every week #4 and we will refill for 1 month. She will follow up for routine testing of vitamin D, at least 2-3 times per year. She was informed of the risk of over-replacement of vitamin D and agrees to not increase her dose unless she discusses this with [name] first. [name] agrees to follow up with our clinic in 3 weeks.  Obesity [name] is currently in the action stage of change. As such, her goal is to continue with weight loss efforts She has agreed to follow the Category 3 plan [name] has been instructed to work up to a goal of 150 minutes of combined cardio and strengthening exercise per week for weight loss and overall health benefits.  We discussed the following Behavioral Modification Strategies today: increasing lean protein intake and work on meal planning and easy cooking plans, keeping healthy foods in the home, better snacking choices, and planning for success   [name] has agreed to follow up with our clinic in 3 weeks. She was informed of the importance of frequent follow up visits to maximize her success with  intensive lifestyle modifications for her multiple health conditions.  ALLERGIES: No Known Allergies  MEDICATIONS: Current Outpatient Medications on File Prior to Visit  Medication Sig Dispense Refill  . acetaminophen (TYLENOL) 500 MG tablet Take 500 mg by mouth every 6 (six) hours as needed.    [name] amLODipine-olmesartan (AZOR) 10-40 MG per tablet Take 1 tablet by mouth daily.    [name] glucose blood test strip Use as instructed 100 each 0  . naproxen (NAPROSYN) 500 MG tablet Take 500 mg by mouth as needed.     . simvastatin (ZOCOR) 40 MG tablet Take 40 mg by mouth daily.    [name] spironolactone (ALDACTONE) 25 MG tablet Take 25 mg by mouth.     No current facility-administered medications on file prior to visit.     PAST MEDICAL HISTORY: Past Medical History:  Diagnosis Date  . Asthma   . Back pain   . Diabetes ([place] [address])   . Hip pain   . Hyperlipidemia   . Hypertension   . Joint pain   . Knee pain   . Neuropathy   . Prediabetes     PAST SURGICAL HISTORY: Past Surgical History:  Procedure Laterality Date  . cyst removal     Right ankle 2016  . fibroid removal     2001-2003    SOCIAL HISTORY: Social History   Tobacco Use  . Smoking status: Never Smoker  . Smokeless tobacco: Never Used  Substance Use Topics  . Alcohol use: Not on file  . Drug use: Not on file    FAMILY HISTORY: Family History  Problem Relation Age of Onset  . Diabetes Mother   . Hyperlipidemia Mother   . Hypertension Mother   . Sleep apnea Mother   . Obesity Mother   . Heart disease Father     ROS: Review of Systems  Constitutional: Positive for malaise/fatigue. Negative for weight loss.  Gastrointestinal: Negative for nausea and vomiting.  Musculoskeletal:       Negative muscle weakness  Endo/Heme/Allergies:       Negative hypoglycemia    PHYSICAL EXAM: Pt in no acute distress  RECENT LABS AND TESTS: BMET    Component Value Date/Time   NA 141 10/31/2018 0835   K 4.4 10/31/2018 0835    CL 103 10/31/2018 0835   CO2 24 10/31/2018 0835   GLUCOSE 91 10/31/2018 0835   BUN 11 10/31/2018 0835   CREATININE 0.72 10/31/2018 0835   CALCIUM 9.7 10/31/2018 0835   GFRNONAA 90 10/31/2018 0835   GFRAA 104 10/31/2018 0835   Lab Results  Component Value Date   HGBA1C 5.5 10/31/2018   HGBA1C 5.7 (H) 04/15/2018   Lab Results  Component Value Date   INSULIN 5.6 04/15/2018   CBC    Component Value Date/Time   WBC 3.6 04/15/2018 0944   RBC 4.88 04/15/2018 0944   HGB 13.9 04/15/2018 0944   HCT 41.6 04/15/2018 0944   MCV 85 04/15/2018 0944   MCH 28.5 04/15/2018 0944   MCHC 33.4 04/15/2018 0944   RDW 12.3 04/15/2018 0944   LYMPHSABS 1.7 04/15/2018 0944   EOSABS  0.1 04/15/2018 0944   BASOSABS 0.0 04/15/2018 0944   Iron/TIBC/Ferritin/ %Sat No results found for: IRON, TIBC, FERRITIN, IRONPCTSAT Lipid Panel     Component Value Date/Time   CHOL 164 04/15/2018 0944   TRIG 109 04/15/2018 0944   HDL 49 04/15/2018 0944   LDLCALC 93 04/15/2018 0944   Hepatic Function Panel     Component Value Date/Time   PROT 7.1 10/31/2018 0835   ALBUMIN 4.7 10/31/2018 0835   AST 30 10/31/2018 0835   ALT 31 10/31/2018 0835   ALKPHOS 58 10/31/2018 0835   BILITOT 0.5 10/31/2018 0835      Component Value Date/Time   TSH 1.380 04/15/2018 0944      I, Trixie Dredge, am acting as transcriptionist for Dennard Nip, MD I have reviewed the above documentation for accuracy and completeness, and I agree with the above. -Dennard Nip, MD

## 2018-12-22 ENCOUNTER — Other Ambulatory Visit (INDEPENDENT_AMBULATORY_CARE_PROVIDER_SITE_OTHER): Payer: Self-pay | Admitting: Family Medicine

## 2018-12-22 DIAGNOSIS — R7303 Prediabetes: Secondary | ICD-10-CM

## 2018-12-31 ENCOUNTER — Telehealth (INDEPENDENT_AMBULATORY_CARE_PROVIDER_SITE_OTHER): Payer: BC Managed Care – PPO | Admitting: Family Medicine

## 2019-01-22 ENCOUNTER — Other Ambulatory Visit (INDEPENDENT_AMBULATORY_CARE_PROVIDER_SITE_OTHER): Payer: Self-pay | Admitting: Family Medicine

## 2019-01-22 DIAGNOSIS — E559 Vitamin D deficiency, unspecified: Secondary | ICD-10-CM

## 2019-03-05 ENCOUNTER — Other Ambulatory Visit: Payer: Self-pay | Admitting: Obstetrics and Gynecology

## 2019-03-11 ENCOUNTER — Encounter (HOSPITAL_BASED_OUTPATIENT_CLINIC_OR_DEPARTMENT_OTHER): Payer: Self-pay | Admitting: *Deleted

## 2019-03-11 ENCOUNTER — Other Ambulatory Visit: Payer: Self-pay

## 2019-03-11 NOTE — Progress Notes (Addendum)
Spoke w/ via phone for pre-op interview---[name] needs dos----  I stat 8             [name] results------ekg 04-15-18 chart/epic COVID test ------03-13-2019 Arrive at -------700 NPO after ------midnight Medications to take morning of surgery -----none Diabetic medication -----none day of surgery Patient Special Instructions ----- Pre-Op special Istructions ----- Patient verbalized understanding of instructions that were given at this phone interview. Patient denies shortness of breath, chest pain, fever, cough a this phone interview.

## 2019-03-12 ENCOUNTER — Encounter (INDEPENDENT_AMBULATORY_CARE_PROVIDER_SITE_OTHER): Payer: Self-pay | Admitting: Family Medicine

## 2019-03-12 ENCOUNTER — Ambulatory Visit (INDEPENDENT_AMBULATORY_CARE_PROVIDER_SITE_OTHER): Payer: BC Managed Care – PPO | Admitting: Family Medicine

## 2019-03-12 ENCOUNTER — Other Ambulatory Visit: Payer: Self-pay

## 2019-03-12 VITALS — BP 101/67 | HR 88 | Temp 98.0°F | Ht 66.0 in | Wt 258.0 lb

## 2019-03-12 DIAGNOSIS — R7303 Prediabetes: Secondary | ICD-10-CM

## 2019-03-12 DIAGNOSIS — E559 Vitamin D deficiency, unspecified: Secondary | ICD-10-CM

## 2019-03-12 DIAGNOSIS — Z9189 Other specified personal risk factors, not elsewhere classified: Secondary | ICD-10-CM | POA: Diagnosis not present

## 2019-03-12 DIAGNOSIS — Z6841 Body Mass Index (BMI) 40.0 and over, adult: Secondary | ICD-10-CM

## 2019-03-12 MED ORDER — VITAMIN D (ERGOCALCIFEROL) 1.25 MG (50000 UNIT) PO CAPS: 50000.0000 [IU] | ORAL_CAPSULE | ORAL | 0 refills | Status: DC

## 2019-03-12 MED ORDER — METFORMIN HCL 500 MG PO TABS: 500.0000 mg | ORAL_TABLET | Freq: Two times a day (BID) | ORAL | 0 refills | Status: DC

## 2019-03-13 ENCOUNTER — Other Ambulatory Visit (HOSPITAL_COMMUNITY)
Admission: RE | Admit: 2019-03-13 | Discharge: 2019-03-13 | Disposition: A | Discharge status: Home / Self Care | Payer: BC Managed Care – PPO | Source: Ambulatory Visit | Attending: Obstetrics and Gynecology | Admitting: Obstetrics and Gynecology

## 2019-03-13 DIAGNOSIS — Z01812 Encounter for preprocedural laboratory examination: Secondary | ICD-10-CM | POA: Insufficient documentation

## 2019-03-13 DIAGNOSIS — Z20828 Contact with and (suspected) exposure to other viral communicable diseases: Secondary | ICD-10-CM | POA: Insufficient documentation

## 2019-03-14 LAB — NOVEL CORONAVIRUS, NAA (HOSP ORDER, SEND-OUT TO REF LAB; TAT 18-24 HRS): SARS-CoV-2, NAA: NOT DETECTED

## 2019-03-16 ENCOUNTER — Telehealth (INDEPENDENT_AMBULATORY_CARE_PROVIDER_SITE_OTHER): Payer: Self-pay

## 2019-03-16 NOTE — Anesthesia Preprocedure Evaluation (Addendum)
Anesthesia Evaluation  Patient identified by MRN, date of birth, ID band Patient awake    Reviewed: Allergy & Precautions, NPO status , Patient's Chart, lab work & pertinent test results  Airway Mallampati: II  TM Distance: >3 FB Neck ROM: Full    Dental  (+) Poor Dentition, Missing, Chipped, Dental Advisory Given,    Pulmonary asthma ,    Pulmonary exam normal breath sounds clear to auscultation       Cardiovascular hypertension, negative cardio ROS Normal cardiovascular exam Rhythm:Regular Rate:Normal     Neuro/Psych negative neurological ROS  negative psych ROS   GI/Hepatic negative GI ROS, Neg liver ROS,   Endo/Other  diabetes, Well Controlled, Type 2, Oral Hypoglycemic AgentsMorbid obesityBMI 42  Per patient, metformin is more for weight loss than diabetes. States she is pre-diabetic- last A1c July 2020 5.5  Renal/GU negative Renal ROS  Female GU complaint Post-menopausal bleeding, endometrial masses    Musculoskeletal  (+) Arthritis , Osteoarthritis,    Abdominal (+) + obese,   Peds negative pediatric ROS (+)  Hematology negative hematology ROS (+)   Anesthesia Other Findings HLD  Reproductive/Obstetrics negative OB ROS                            Anesthesia Physical Anesthesia Plan  ASA: III  Anesthesia Plan: General   Post-op Pain Management:    Induction: Intravenous  PONV Risk Score and Plan: 3 and Ondansetron, Dexamethasone, Midazolam, Scopolamine patch - Pre-op and Treatment may vary due to age or medical condition  Airway Management Planned: LMA  Additional Equipment: None  Intra-op Plan:   Post-operative Plan: Extubation in OR  Informed Consent: I have reviewed the patients History and Physical, chart, labs and discussed the procedure including the risks, benefits and alternatives for the proposed anesthesia with the patient or authorized representative who has  indicated his/her understanding and acceptance.     Dental advisory given  Plan Discussed with: CRNA  Anesthesia Plan Comments:         Anesthesia Quick Evaluation

## 2019-03-16 NOTE — Progress Notes (Signed)
Office: [phone]  /  Fax: [phone]   HPI:  Chief Complaint: OBESITY [name] is here to discuss her progress with her obesity treatment plan. She is on the Category 3 plan and states she is following her eating plan approximately 40-50% of the time. She states she is walking 20 minutes 3 times per week.  [name] has been working on her diet and has lost weight since her last visit, which was a few months ago. She has done well with PC over Thanksgiving and feels she will do well over Christmas.  Today's visit was #14 Starting weight: 264 lbs Starting date: 04/15/2018 Today's weight: 258 lbs  Today's date: 03/12/2019 Total lbs lost to date: 6 Total lbs lost since last in-office visit: 5  Prediabetes [name] has a diagnosis of prediabetes and has been working on her diet and her last A1c this summer had improved. She is tolerating metformin well and requests a refill.  At risk for diabetes [name] is at higher than average risk for developing diabetes due to her obesity.   Vitamin D deficiency [name] has a diagnosis of Vitamin D deficiency. She is off prescription Vitamin D and is taking OTC Vitamin D3. She is due to have labs done soon.  ASSESSMENT AND PLAN:  Prediabetes - Plan: metFORMIN (GLUCOPHAGE) 500 MG tablet  Vitamin D deficiency - Plan: Vitamin D, Ergocalciferol, (DRISDOL) 1.25 MG (50000 UT) CAPS capsule  At risk for diabetes mellitus  Class 3 severe obesity with serious comorbidity and body mass index (BMI) of 40.0 to 44.9 in adult, unspecified obesity type [facility])  PLAN:  Pre-Diabetes [name] was given a refill on her metformin 500 mg BID #60 with 0 refills and agrees to follow-up with our clinic in 4 weeks. She will have labs done in 1 months. She  will continue to work on weight loss, exercise, and decreasing simple carbohydrates to help decrease the risk of diabetes.   Diabetes risk counseling (~15 min) [name] is a 63 y.o. female and has risk factors  for diabetes including obesity. We discussed intensive lifestyle modifications today with an emphasis on weight loss as well as increasing exercise and decreasing simple carbohydrates in her diet.  Vitamin D Deficiency [name] was informed that low Vitamin D levels contributes to fatigue and are associated with obesity, breast, and colon cancer. She agrees to continue to take prescription Vit D @ 50,000 IU every week #4 with 0 refills and will follow-up for routine testing of Vitamin D in 1 month. She was informed of the risk of over-replacement of Vitamin D and agrees to not increase her dose unless she discusses this with [name] first. Hadia agrees to follow-up with our clinic in 4 weeks.  Obesity [name] is currently in the action stage of change. As such, her goal is to continue with weight loss efforts She has agreed to follow the Category 3 plan [name] has been instructed to work up to a goal of 150 minutes of combined cardio and strengthening exercise per week for weight loss and overall health benefits. We discussed the following Behavioral Modification Stratagies today: holiday eating strategies.  [name] has agreed to follow-up with our clinic in 4 weeks. She was informed of the importance of frequent follow-up visits to maximize her success with intensive lifestyle modifications for her multiple health conditions.  ALLERGIES: No Known Allergies  MEDICATIONS: Current Outpatient Medications on File Prior to Visit  Medication Sig Dispense Refill  . acetaminophen (TYLENOL) 500 MG tablet Take 500 mg  by mouth every 6 (six) hours as needed.    [name] amLODipine-olmesartan (AZOR) 10-40 MG per tablet Take 1 tablet by mouth at bedtime.     . Ascorbic Acid (VITAMIN C) 100 MG tablet Take 100 mg by mouth daily. 2 tabs daily    . ELDERBERRY PO Take by mouth. daily    . glucose blood test strip Use as instructed 100 each 0  . Multiple Vitamins-Minerals (MULTIVITAMIN WITH MINERALS) tablet Take 1 tablet  by mouth daily. 2 tabs in am    . naproxen (NAPROSYN) 500 MG tablet Take 500 mg by mouth as needed.     . simvastatin (ZOCOR) 40 MG tablet Take 40 mg by mouth at bedtime.     [name] spironolactone (ALDACTONE) 25 MG tablet Take 25 mg by mouth.    . Vitamin D, Cholecalciferol, 25 MCG (1000 UT) TABS Take by mouth. D 3     No current facility-administered medications on file prior to visit.    PAST MEDICAL HISTORY: Past Medical History:  Diagnosis Date  . Asthma   . Back pain   . Hip pain    right hip cortisone injection for pain 03-10-2019  . Hyperlipidemia   . Hypertension   . Joint pain   . Knee pain   . Neuropathy    feet  . Pre-diabetes    on metformin    PAST SURGICAL HISTORY: Past Surgical History:  Procedure Laterality Date  . colonscopy    . cyst removal     Right ankle 2016  . fibroid removal     2001-2003    SOCIAL HISTORY: Social History   Tobacco Use  . Smoking status: Never Smoker  . Smokeless tobacco: Never Used  Substance Use Topics  . Alcohol use: Not Currently  . Drug use: Never    FAMILY HISTORY: Family History  Problem Relation Age of Onset  . Diabetes Mother   . Hyperlipidemia Mother   . Hypertension Mother   . Sleep apnea Mother   . Obesity Mother   . Heart disease Father    ROS: ROS none noted.  PHYSICAL EXAM: Blood pressure 101/67, pulse 88, temperature 98 F (36.7 C), temperature source Oral, height 5\' 6"  (1.676 m), weight 258 lb (117 kg), SpO2 99 %. Body mass index is 41.64 kg/m. Physical Exam Vitals reviewed.  Constitutional:      Appearance: Normal appearance. She is obese.  Cardiovascular:     Rate and Rhythm: Normal rate.     Pulses: Normal pulses.  Pulmonary:     Effort: Pulmonary effort is normal.     Breath sounds: Normal breath sounds.  Musculoskeletal:        General: Normal range of motion.  Skin:    General: Skin is warm and dry.  Neurological:     Mental Status: She is alert and oriented to person, place, and  time.  Psychiatric:        Behavior: Behavior normal.   RECENT LABS AND TESTS: BMET    Component Value Date/Time   NA 141 10/31/2018 0835   K 4.4 10/31/2018 0835   CL 103 10/31/2018 0835   CO2 24 10/31/2018 0835   GLUCOSE 91 10/31/2018 0835   BUN 11 10/31/2018 0835   CREATININE 0.72 10/31/2018 0835   CALCIUM 9.7 10/31/2018 0835   GFRNONAA 90 10/31/2018 0835   GFRAA 104 10/31/2018 0835   Lab Results  Component Value Date   HGBA1C 5.5 10/31/2018   HGBA1C 5.7 (H) 04/15/2018  Lab Results  Component Value Date   INSULIN 5.6 04/15/2018   CBC    Component Value Date/Time   WBC 3.6 04/15/2018 0944   RBC 4.88 04/15/2018 0944   HGB 13.9 04/15/2018 0944   HCT 41.6 04/15/2018 0944   MCV 85 04/15/2018 0944   MCH 28.5 04/15/2018 0944   MCHC 33.4 04/15/2018 0944   RDW 12.3 04/15/2018 0944   LYMPHSABS 1.7 04/15/2018 0944   EOSABS 0.1 04/15/2018 0944   BASOSABS 0.0 04/15/2018 0944   Iron/TIBC/Ferritin/ %Sat No results found for: IRON, TIBC, FERRITIN, IRONPCTSAT Lipid Panel     Component Value Date/Time   CHOL 164 04/15/2018 0944   TRIG 109 04/15/2018 0944   HDL 49 04/15/2018 0944   LDLCALC 93 04/15/2018 0944   Hepatic Function Panel     Component Value Date/Time   PROT 7.1 10/31/2018 0835   ALBUMIN 4.7 10/31/2018 0835   AST 30 10/31/2018 0835   ALT 31 10/31/2018 0835   ALKPHOS 58 10/31/2018 0835   BILITOT 0.5 10/31/2018 0835      Component Value Date/Time   TSH 1.380 04/15/2018 0944      I, Michaelene Song, am acting as Location manager for Dennard Nip, MD I have reviewed the above documentation for accuracy and completeness, and I agree with the above. -Dennard Nip, MD

## 2019-03-17 ENCOUNTER — Encounter (HOSPITAL_BASED_OUTPATIENT_CLINIC_OR_DEPARTMENT_OTHER): Payer: Self-pay | Admitting: Obstetrics and Gynecology

## 2019-03-17 ENCOUNTER — Other Ambulatory Visit (HOSPITAL_COMMUNITY): Payer: Self-pay

## 2019-03-17 ENCOUNTER — Other Ambulatory Visit: Payer: Self-pay

## 2019-03-17 ENCOUNTER — Encounter (HOSPITAL_BASED_OUTPATIENT_CLINIC_OR_DEPARTMENT_OTHER)
Admission: RE | Disposition: A | Discharge status: Home / Self Care | Payer: Self-pay | Source: Home / Self Care | Attending: Obstetrics and Gynecology

## 2019-03-17 ENCOUNTER — Ambulatory Visit (HOSPITAL_BASED_OUTPATIENT_CLINIC_OR_DEPARTMENT_OTHER)
Admission: RE | Admit: 2019-03-17 | Discharge: 2019-03-17 | Disposition: A | Discharge status: Home / Self Care | Payer: BC Managed Care – PPO | Attending: Obstetrics and Gynecology | Admitting: Obstetrics and Gynecology

## 2019-03-17 ENCOUNTER — Ambulatory Visit (HOSPITAL_BASED_OUTPATIENT_CLINIC_OR_DEPARTMENT_OTHER): Payer: BC Managed Care – PPO | Admitting: Anesthesiology

## 2019-03-17 ENCOUNTER — Ambulatory Visit (HOSPITAL_COMMUNITY): Payer: BC Managed Care – PPO

## 2019-03-17 DIAGNOSIS — Z7984 Long term (current) use of oral hypoglycemic drugs: Secondary | ICD-10-CM | POA: Insufficient documentation

## 2019-03-17 DIAGNOSIS — M4802 Spinal stenosis, cervical region: Secondary | ICD-10-CM | POA: Diagnosis not present

## 2019-03-17 DIAGNOSIS — D259 Leiomyoma of uterus, unspecified: Secondary | ICD-10-CM | POA: Diagnosis not present

## 2019-03-17 DIAGNOSIS — E785 Hyperlipidemia, unspecified: Secondary | ICD-10-CM | POA: Diagnosis not present

## 2019-03-17 DIAGNOSIS — I10 Essential (primary) hypertension: Secondary | ICD-10-CM | POA: Diagnosis not present

## 2019-03-17 DIAGNOSIS — N950 Postmenopausal bleeding: Secondary | ICD-10-CM | POA: Diagnosis present

## 2019-03-17 DIAGNOSIS — Z79899 Other long term (current) drug therapy: Secondary | ICD-10-CM | POA: Insufficient documentation

## 2019-03-17 DIAGNOSIS — R9389 Abnormal findings on diagnostic imaging of other specified body structures: Secondary | ICD-10-CM | POA: Insufficient documentation

## 2019-03-17 DIAGNOSIS — Z6841 Body Mass Index (BMI) 40.0 and over, adult: Secondary | ICD-10-CM | POA: Insufficient documentation

## 2019-03-17 DIAGNOSIS — N882 Stricture and stenosis of cervix uteri: Secondary | ICD-10-CM

## 2019-03-17 DIAGNOSIS — R7303 Prediabetes: Secondary | ICD-10-CM | POA: Insufficient documentation

## 2019-03-17 HISTORY — PX: DILATATION & CURETTAGE/HYSTEROSCOPY WITH MYOSURE: SHX6511

## 2019-03-17 HISTORY — DX: Prediabetes: R73.03

## 2019-03-17 LAB — POCT I-STAT, CHEM 8
BUN: 14 mg/dL (ref 8–23)
Calcium, Ion: 1.25 mmol/L (ref 1.15–1.40)
Chloride: 101 mmol/L (ref 98–111)
Creatinine, Ser: 0.8 mg/dL (ref 0.44–1.00)
Glucose, Bld: 101 mg/dL — ABNORMAL HIGH (ref 70–99)
HCT: 46 % (ref 36.0–46.0)
Hemoglobin: 15.6 g/dL — ABNORMAL HIGH (ref 12.0–15.0)
Potassium: 3.9 mmol/L (ref 3.5–5.1)
Sodium: 139 mmol/L (ref 135–145)
TCO2: 28 mmol/L (ref 22–32)

## 2019-03-17 LAB — GLUCOSE, CAPILLARY: Glucose-Capillary: 115 mg/dL — ABNORMAL HIGH (ref 70–99)

## 2019-03-17 SURGERY — DILATATION & CURETTAGE/HYSTEROSCOPY WITH MYOSURE
Anesthesia: General | Site: Uterus

## 2019-03-17 MED ORDER — KETOROLAC TROMETHAMINE 30 MG/ML IJ SOLN
30.0000 mg | Freq: Once | INTRAMUSCULAR | Status: DC | PRN
  Filled 2019-03-17: qty 1

## 2019-03-17 MED ORDER — ACETAMINOPHEN 500 MG PO TABS
1000.0000 mg | ORAL_TABLET | Freq: Once | ORAL | Status: AC
  Administered 2019-03-17: 08:00:00 1000 mg via ORAL
  Filled 2019-03-17: qty 2

## 2019-03-17 MED ORDER — MIDAZOLAM HCL 2 MG/2ML IJ SOLN
INTRAMUSCULAR | Status: DC | PRN
  Administered 2019-03-17: 1 mg via INTRAVENOUS

## 2019-03-17 MED ORDER — FENTANYL CITRATE (PF) 100 MCG/2ML IJ SOLN
INTRAMUSCULAR | Status: AC
  Filled 2019-03-17: qty 2

## 2019-03-17 MED ORDER — SODIUM CHLORIDE 0.9 % IR SOLN
Status: DC | PRN
  Administered 2019-03-17: 500 mL

## 2019-03-17 MED ORDER — SCOPOLAMINE 1 MG/3DAYS TD PT72
1.0000 | MEDICATED_PATCH | TRANSDERMAL | Status: DC
  Administered 2019-03-17: 1.5 mg via TRANSDERMAL
  Filled 2019-03-17: qty 1

## 2019-03-17 MED ORDER — KETOROLAC TROMETHAMINE 30 MG/ML IJ SOLN
INTRAMUSCULAR | Status: DC | PRN
  Administered 2019-03-17: 30 mg via INTRAVENOUS

## 2019-03-17 MED ORDER — ACETAMINOPHEN 500 MG PO TABS
ORAL_TABLET | ORAL | Status: AC
  Filled 2019-03-17: qty 2

## 2019-03-17 MED ORDER — KETOROLAC TROMETHAMINE 30 MG/ML IJ SOLN
INTRAMUSCULAR | Status: AC
  Filled 2019-03-17: qty 1

## 2019-03-17 MED ORDER — SCOPOLAMINE 1 MG/3DAYS TD PT72
MEDICATED_PATCH | TRANSDERMAL | Status: AC
  Filled 2019-03-17: qty 1

## 2019-03-17 MED ORDER — DEXAMETHASONE SODIUM PHOSPHATE 10 MG/ML IJ SOLN
INTRAMUSCULAR | Status: DC | PRN
  Administered 2019-03-17: 6 mg via INTRAVENOUS

## 2019-03-17 MED ORDER — PROMETHAZINE HCL 25 MG/ML IJ SOLN
6.2500 mg | INTRAMUSCULAR | Status: DC | PRN
  Administered 2019-03-17: 12:00:00 6.25 mg via INTRAVENOUS
  Filled 2019-03-17: qty 1

## 2019-03-17 MED ORDER — ONDANSETRON HCL 4 MG/2ML IJ SOLN
INTRAMUSCULAR | Status: AC
  Filled 2019-03-17: qty 2

## 2019-03-17 MED ORDER — OXYCODONE HCL 5 MG PO TABS
5.0000 mg | ORAL_TABLET | Freq: Once | ORAL | Status: AC | PRN
  Administered 2019-03-17: 5 mg via ORAL
  Filled 2019-03-17: qty 1

## 2019-03-17 MED ORDER — PROPOFOL 10 MG/ML IV BOLUS
INTRAVENOUS | Status: DC | PRN
  Administered 2019-03-17: 150 mg via INTRAVENOUS

## 2019-03-17 MED ORDER — OXYCODONE HCL 5 MG/5ML PO SOLN
5.0000 mg | Freq: Once | ORAL | Status: AC | PRN
  Filled 2019-03-17: qty 5

## 2019-03-17 MED ORDER — MIDAZOLAM HCL 2 MG/2ML IJ SOLN
INTRAMUSCULAR | Status: AC
  Filled 2019-03-17: qty 2

## 2019-03-17 MED ORDER — ONDANSETRON HCL 4 MG/2ML IJ SOLN
INTRAMUSCULAR | Status: DC | PRN
  Administered 2019-03-17: 4 mg via INTRAVENOUS

## 2019-03-17 MED ORDER — FENTANYL CITRATE (PF) 100 MCG/2ML IJ SOLN
INTRAMUSCULAR | Status: DC | PRN
  Administered 2019-03-17: 100 ug via INTRAVENOUS

## 2019-03-17 MED ORDER — LACTATED RINGERS IV SOLN
INTRAVENOUS | Status: DC
  Filled 2019-03-17: qty 1000

## 2019-03-17 MED ORDER — HYDROMORPHONE HCL 1 MG/ML IJ SOLN
0.2500 mg | INTRAMUSCULAR | Status: DC | PRN
  Filled 2019-03-17: qty 0.5

## 2019-03-17 MED ORDER — OXYCODONE HCL 5 MG PO TABS
ORAL_TABLET | ORAL | Status: AC
  Filled 2019-03-17: qty 1

## 2019-03-17 MED ORDER — DEXAMETHASONE SODIUM PHOSPHATE 10 MG/ML IJ SOLN
INTRAMUSCULAR | Status: AC
  Filled 2019-03-17: qty 1

## 2019-03-17 MED ORDER — PROMETHAZINE HCL 25 MG/ML IJ SOLN
INTRAMUSCULAR | Status: AC
  Filled 2019-03-17: qty 1

## 2019-03-17 MED ORDER — MEPERIDINE HCL 25 MG/ML IJ SOLN
6.2500 mg | INTRAMUSCULAR | Status: DC | PRN
  Filled 2019-03-17: qty 1

## 2019-03-17 MED ORDER — LIDOCAINE HCL (CARDIAC) PF 100 MG/5ML IV SOSY
PREFILLED_SYRINGE | INTRAVENOUS | Status: DC | PRN
  Administered 2019-03-17: 60 mg via INTRAVENOUS

## 2019-03-17 MED ORDER — PROPOFOL 10 MG/ML IV BOLUS
INTRAVENOUS | Status: AC
  Filled 2019-03-17: qty 20

## 2019-03-17 MED ORDER — LIDOCAINE 2% (20 MG/ML) 5 ML SYRINGE
INTRAMUSCULAR | Status: AC
  Filled 2019-03-17: qty 5

## 2019-03-17 SURGICAL SUPPLY — 28 items
BIPOLAR CUTTING LOOP 21FR (ELECTRODE)
CANISTER SUCT 3000ML PPV (MISCELLANEOUS) ×3 IMPLANT
CATH ROBINSON RED A/P 14FR (CATHETERS) ×3 IMPLANT
CATH ROBINSON RED A/P 16FR (CATHETERS) ×3 IMPLANT
COVER WAND RF STERILE (DRAPES) IMPLANT
CURETTE PIPELLE ENDOMTRL SUCTN (MISCELLANEOUS) ×1 IMPLANT
DEVICE MYOSURE LITE (MISCELLANEOUS) IMPLANT
DEVICE MYOSURE REACH (MISCELLANEOUS) IMPLANT
DILATOR CANAL MILEX (MISCELLANEOUS) ×3 IMPLANT
GAUZE 4X4 16PLY RFD (DISPOSABLE) ×3 IMPLANT
GLOVE BIOGEL PI IND STRL 7.0 (GLOVE) ×2 IMPLANT
GLOVE BIOGEL PI INDICATOR 7.0 (GLOVE) ×4
GLOVE ECLIPSE 6.5 STRL STRAW (GLOVE) ×3 IMPLANT
GOWN STRL REUS W/TWL LRG LVL3 (GOWN DISPOSABLE) ×6 IMPLANT
IV NS IRRIG 3000ML ARTHROMATIC (IV SOLUTION) ×3 IMPLANT
KIT PROCEDURE FLUENT (KITS) ×3 IMPLANT
KIT TURNOVER CYSTO (KITS) ×3 IMPLANT
LOOP CUTTING BIPOLAR 21FR (ELECTRODE) IMPLANT
MYOSURE XL FIBROID (MISCELLANEOUS)
PACK VAGINAL MINOR WOMEN LF (CUSTOM PROCEDURE TRAY) ×3 IMPLANT
PAD OB MATERNITY 4.3X12.25 (PERSONAL CARE ITEMS) ×3 IMPLANT
PAD PREP 24X48 CUFFED NSTRL (MISCELLANEOUS) ×3 IMPLANT
PIPELLE ENDOMETRIAL SUCTION CU (MISCELLANEOUS) ×3
SEAL CERVICAL OMNI LOK (ABLATOR) IMPLANT
SEAL ROD LENS SCOPE MYOSURE (ABLATOR) IMPLANT
SYSTEM TISS REMOVAL MYOSURE XL (MISCELLANEOUS) IMPLANT
TOWEL OR 17X26 10 PK STRL BLUE (TOWEL DISPOSABLE) ×3 IMPLANT
WATER STERILE IRR 500ML POUR (IV SOLUTION) IMPLANT

## 2019-03-17 NOTE — Transfer of Care (Signed)
Immediate Anesthesia Transfer of Care Note  Patient: [name]  Procedure(s) Performed: EXAMINE UNDER ANESTHESIA;  ATTEMPTENTED DIAGNOSTIC HYSTEROSCOPY (N/A Uterus)  Patient Location: PACU  Anesthesia Type:General  Level of Consciousness: awake, alert , oriented, drowsy and patient cooperative  Airway & Oxygen Therapy: Patient Spontanous Breathing and Patient connected to nasal cannula oxygen  Post-op Assessment: Report given to RN and Post -op Vital signs reviewed and stable  Post vital signs: Reviewed and stable  Last Vitals:  Vitals Value Taken Time  BP 106/65 03/17/19 1049  Temp    Pulse 56 03/17/19 1055  Resp 17 03/17/19 1055  SpO2 94 % 03/17/19 1055  Vitals shown include unvalidated device data.  Last Pain:  Vitals:   03/17/19 0744  TempSrc: Oral  PainSc: 2       Patients Stated Pain Goal: 4 ([id number])  Complications: No apparent anesthesia complications

## 2019-03-17 NOTE — Anesthesia Procedure Notes (Addendum)
Procedure Name: LMA Insertion Date/Time: 03/17/2019 9:29 AM Performed by: Raenette Rover, CRNA Pre-anesthesia Checklist: Patient identified, Emergency Drugs available, Suction available, Patient being monitored and Timeout performed Patient Re-evaluated:Patient Re-evaluated prior to induction Oxygen Delivery Method: Circle system utilized Preoxygenation: Pre-oxygenation with 100% oxygen Induction Type: IV induction LMA: LMA inserted LMA Size: 4.0 Number of attempts: 1 Placement Confirmation: positive ETCO2 and breath sounds checked- equal and bilateral Dental Injury: Teeth and Oropharynx as per pre-operative assessment  Comments: Placed by Yetta Barre CRNA

## 2019-03-17 NOTE — Op Note (Signed)
NAME: [name], VALLEROY MEDICAL RECORD [id number] ACCOUNT [id number] DATE OF BIRTH:30-Aug-1955 FACILITY: [facility] LOCATION: [facility] PHYSICIAN:Nathanael Krist A. Abrea Henle, MD  OPERATIVE REPORT  DATE OF PROCEDURE:  03/17/2019  PREOPERATIVE DIAGNOSES:   1.  Postmenopausal bleeding.  2.  Endometrial thickening on ultrasound.  3.  Endometrial mass 4.  Fibroid uterus.  PROCEDURE:   1.  Examination under anesthesia.  2.  Attempted diagnostic hysteroscopy.  POSTOPERATIVE DIAGNOSES:   1.  Postmenopausal bleeding.  2.  Severe cervical stenosis.  3.  Endometrial thickening on ultrasound.  4.  Fibroid uterus.  ANESTHESIA:  General.  SURGEON:  Servando Salina, MD  ASSISTANT:  None.  DESCRIPTION OF PROCEDURE:  Under adequate general anesthesia, the patient was placed in the dorsal lithotomy position.  She was sterilely prepped and draped in the usual fashion.  Bladder was catheterized for a small amount of urine.  Examination under anesthesia had revealed about a 12-week size irregular uterus.  No adnexal masses were palpable, but limited by the patient's body habitus.  A bivalve speculum was placed in the vagina.  Single-tooth tenaculum was placed on the anterior lip of the  cervix.  Initial attempt at using a small dilator could not traverse the internal os.  The patient had been prepped with Cytotec for cervical ripening.  An os finder was utilized, again also was not able to traverse the internal os.  At that point, a decision was made to use the diagnostic hysteroscope to look for the internal os and 2 small openings could be seen.  At reattempting at dilation including usage of the lacrimal probe, the half size Hegar's dilators were unsuccessful.  Therefore, the 2.9 diagnostic scope was then utilized and attempted to be traversed through the openings noted.  They  appears to be false passages which was not amenable to getting into the endometrial cavity.  The bladder was then retrofilled.   Ultrasound was called to do ultrasound-guided  attempted dilation, if not a biopsy.  Even with intraoperative ultrasound,  you can see that fibroid uterus and fibroids surrounding the lower uterine segment.  However, the cervix was not clearly delineated.  Therefore, I utilized the uterine sound to try to see if I could mimic  the potential location of the endocervical canal.  I was not still able to traverse the endometrial cavity and an attempt at using an endometrial biopsy with a curved pipelle was also unsuccessful.  The procedure at that was  aborted, with the bladder being emptied, all instruments being removed from the vagina.  SPECIMEN:  None.  ESTIMATED BLOOD LOSS:   5 mL.  COMPLICATIONS:  None.  DISPOSITION:  The patient tolerated the procedure well and was transferred to the recovery room in stable condition.  VN/NUANCE  D:03/17/2019 T:03/17/2019 JOB:[id number]

## 2019-03-17 NOTE — Discharge Instructions (Signed)
  Post Anesthesia Home Care Instructions  Activity: Get plenty of rest for the remainder of the day. A responsible individual must stay with you for 24 hours following the procedure.  For the next 24 hours, DO NOT: -Drive a car -Operate machinery -Drink alcoholic beverages -Take any medication unless instructed by your physician -Make any legal decisions or sign important papers.  Meals: Start with liquid foods such as gelatin or soup. Progress to regular foods as tolerated. Avoid greasy, spicy, heavy foods. If nausea and/or vomiting occur, drink only clear liquids until the nausea and/or vomiting subsides. Call your physician if vomiting continues.  Special Instructions/Symptoms: Your throat may feel dry or sore from the anesthesia or the breathing tube placed in your throat during surgery. If this causes discomfort, gargle with warm salt water. The discomfort should disappear within 24 hours.  If you had a scopolamine patch placed behind your ear for the management of post- operative nausea and/or vomiting:  1. The medication in the patch is effective for 72 hours, after which it should be removed.  Wrap patch in a tissue and discard in the trash. Wash hands thoroughly with soap and water. 2. You may remove the patch earlier than 72 hours if you experience unpleasant side effects which may include dry mouth, dizziness or visual disturbances. 3. Avoid touching the patch. Wash your hands with soap and water after contact with the patch.      D & C Home care Instructions:   Personal hygiene:  Used sanitary napkins for vaginal drainage not tampons. Shower or tub bathe the day after your procedure. No douching until bleeding stops. Always wipe from front to back after  Elimination.  Activity: Do not drive or operate any equipment today. The effects of the anesthesia are still present and drowsiness may result. Rest today, not necessarily flat bed rest, just take it easy. You may resume your  normal activity in one to 2 days.  Sexual activity: No intercourse for one week or as indicated by your physician  Diet: Eat a light diet as desired this evening. You may resume a regular diet tomorrow.  Return to work: One to 2 days.  General Expectations of your surgery: Vaginal bleeding should be no heavier than a normal period. Spotting may continue up to 10 days. Mild cramps may continue for a couple of days. You may have a regular period in 2-6 weeks.  Unexpected observations call your doctor if these occur: persistent or heavy bleeding. Severe abdominal cramping or pain. Elevation of temperature greater than 100F.      

## 2019-03-17 NOTE — H&P (Signed)
[name] is an 63 y.o. female P2 BF with PMB here for surgical management of endometrial masses noted on sono hysterogram done for endometrial thickening. Pt is still bleeding  Pertinent Gynecological History: Menses: post-menopausal Bleeding: post menopausal bleeding Contraception: none DES exposure: denies Blood transfusions: none Sexually transmitted diseases: no past history Previous GYN Procedures: myomectomy  Last mammogram: normal Date: 02/12/2019 Last pap: normal Date: 02/2019 OB History: G4P2   Menstrual History: Menarche age: n/a No LMP recorded. Patient is postmenopausal.    Past Medical History:  Diagnosis Date  . Asthma   . Back pain   . Hip pain    right hip cortisone injection for pain 03-10-2019  . Hyperlipidemia   . Hypertension   . Joint pain   . Knee pain   . Neuropathy    feet  . Pre-diabetes    on metformin    Past Surgical History:  Procedure Laterality Date  . colonscopy    . cyst removal     Right ankle 2016  . fibroid removal     2001-2003    Family History  Problem Relation Age of Onset  . Diabetes Mother   . Hyperlipidemia Mother   . Hypertension Mother   . Sleep apnea Mother   . Obesity Mother   . Heart disease Father     Social History:  reports that she has never smoked. She has never used smokeless tobacco. She reports previous alcohol use. She reports that she does not use drugs.  Allergies: No Known Allergies  Medications Prior to Admission  Medication Sig Dispense Refill Last Dose  . Ascorbic Acid (VITAMIN C) 100 MG tablet Take 100 mg by mouth daily. 2 tabs daily     . ELDERBERRY PO Take by mouth. daily     . Multiple Vitamins-Minerals (MULTIVITAMIN WITH MINERALS) tablet Take 1 tablet by mouth daily. 2 tabs in am     . Vitamin D, Cholecalciferol, 25 MCG (1000 UT) TABS Take by mouth. D 3     . acetaminophen (TYLENOL) 500 MG tablet Take 500 mg by mouth every 6 (six) hours as needed.     [name] amLODipine-olmesartan  (AZOR) 10-40 MG per tablet Take 1 tablet by mouth at bedtime.      [name] glucose blood test strip Use as instructed 100 each 0   . metFORMIN (GLUCOPHAGE) 500 MG tablet Take 1 tablet (500 mg total) by mouth 2 (two) times daily with a meal. 60 tablet 0   . naproxen (NAPROSYN) 500 MG tablet Take 500 mg by mouth as needed.    02/25/2019  . simvastatin (ZOCOR) 40 MG tablet Take 40 mg by mouth at bedtime.      [name] spironolactone (ALDACTONE) 25 MG tablet Take 25 mg by mouth.     . Vitamin D, Ergocalciferol, (DRISDOL) 1.25 MG (50000 UT) CAPS capsule Take 1 capsule (50,000 Units total) by mouth every 7 (seven) days. 4 capsule 0     Review of Systems  All other systems reviewed and are negative.   Height 5\' 6"  (1.676 m), weight 117 kg. Physical Exam  Constitutional: She is oriented to person, place, and time. She appears well-nourished.  HENT:  Head: Atraumatic.  Cardiovascular: Regular rhythm.  Respiratory: Effort normal.  GI: Soft.  Genitourinary:    Vagina normal.     Genitourinary Comments: Cervix parous Adnexa non palp Uterus sl enlarged   Musculoskeletal:        General: Normal range of motion.  Cervical back: Neck supple.  Neurological: She is alert and oriented to person, place, and time. She has normal reflexes.  Skin: Skin is warm and dry.  Psychiatric: She has a normal mood and affect.    Results for orders placed or performed during the hospital encounter of 03/17/19 (from the past 24 hour(s))  I-STAT, chem 8     Status: Abnormal   Collection Time: 03/17/19  7:30 AM  Result Value Ref Range   Sodium 139 135 - 145 mmol/L   Potassium 3.9 3.5 - 5.1 mmol/L   Chloride 101 98 - 111 mmol/L   BUN 14 8 - 23 mg/dL   Creatinine, Ser 0.80 0.44 - 1.00 mg/dL   Glucose, Bld 101 (H) 70 - 99 mg/dL   Calcium, Ion 1.25 1.15 - 1.40 mmol/L   TCO2 28 22 - 32 mmol/L   Hemoglobin 15.6 (H) 12.0 - 15.0 g/dL   HCT 46.0 36.0 - 46.0 %    No results found.  Assessment/Plan: PMB Endometrial  masses Endometrial thickening on sonogram P) dx hysteroscopy, D&C, resection of endometrial masses. Risk of surgery reviewed including infection, bleeding, uterine perforation and its risk, thermal injury, injury to surrounding organ structures,fluid overload and its consequences. All ? answered  Adesuwa Osgood A Brenae Lasecki 03/17/2019, 7:38 AM

## 2019-03-17 NOTE — Anesthesia Procedure Notes (Signed)
Procedure Name: LMA Insertion Date/Time: 03/17/2019 9:29 AM Performed by: Justice Rocher, CRNA Pre-anesthesia Checklist: Patient identified, Emergency Drugs available, Suction available, Patient being monitored and Timeout performed Patient Re-evaluated:Patient Re-evaluated prior to induction Oxygen Delivery Method: Circle system utilized Preoxygenation: Pre-oxygenation with 100% oxygen Induction Type: IV induction LMA: LMA inserted LMA Size: 5.0 Number of attempts: 1 Placement Confirmation: positive ETCO2 and breath sounds checked- equal and bilateral Dental Injury: Teeth and Oropharynx as per pre-operative assessment

## 2019-03-17 NOTE — Brief Op Note (Signed)
03/17/2019  10:57 AM  PATIENT:  [name]  63 y.o. female  PRE-OPERATIVE DIAGNOSIS:  Postmenopausal Bleeding, Endometrial Masses  POST-OPERATIVE DIAGNOSIS:  Postmenopausal Bleeding, Endometrial Masses,   Cervical Stenosis, and Uterine Fibroid  PROCEDURE:  EUA, attempted diagnostic hysteroscopy  SURGEON:  Surgeon(s) and Role:    * Servando Salina, MD - Primary  PHYSICIAN ASSISTANT:   ASSISTANTS: none   ANESTHESIA:   general  EBL:  10 mL   BLOOD ADMINISTERED:none  DRAINS: none   LOCAL MEDICATIONS USED:  NONE  SPECIMEN:  No Specimen  DISPOSITION OF SPECIMEN:  N/A  COUNTS:  YES  TOURNIQUET:  * No tourniquets in log *  DICTATION: .Other Dictation: Dictation Number [id number]  PLAN OF CARE: Discharge to home after PACU  PATIENT DISPOSITION:  PACU - hemodynamically stable.   Delay start of Pharmacological VTE agent (>24hrs) due to surgical blood loss or risk of bleeding: no

## 2019-03-17 NOTE — Anesthesia Postprocedure Evaluation (Signed)
Anesthesia Post Note  Patient: [name]  Procedure(s) Performed: EXAMINE UNDER ANESTHESIA;  ATTEMPTENTED DIAGNOSTIC HYSTEROSCOPY (N/A Uterus)     Patient location during evaluation: PACU Anesthesia Type: General Level of consciousness: awake and alert, oriented and patient cooperative Pain management: pain level controlled Vital Signs Assessment: post-procedure vital signs reviewed and stable Respiratory status: spontaneous breathing, nonlabored ventilation and respiratory function stable Cardiovascular status: blood pressure returned to baseline and stable Postop Assessment: no apparent nausea or vomiting Anesthetic complications: no    Last Vitals:  Vitals:   03/17/19 1100 03/17/19 1115  BP: 109/66 114/69  Pulse: (!) 50 (!) 58  Resp: 15 15  Temp:    SpO2: 95% 95%    Last Pain:  Vitals:   03/17/19 1049  TempSrc:   PainSc: 0-No pain                 Pervis Hocking

## 2019-04-08 ENCOUNTER — Other Ambulatory Visit (INDEPENDENT_AMBULATORY_CARE_PROVIDER_SITE_OTHER): Payer: Self-pay | Admitting: Family Medicine

## 2019-04-08 DIAGNOSIS — E559 Vitamin D deficiency, unspecified: Secondary | ICD-10-CM

## 2019-04-08 DIAGNOSIS — R7303 Prediabetes: Secondary | ICD-10-CM

## 2019-04-10 DIAGNOSIS — N950 Postmenopausal bleeding: Secondary | ICD-10-CM | POA: Insufficient documentation

## 2019-04-13 ENCOUNTER — Other Ambulatory Visit: Payer: Self-pay

## 2019-04-13 ENCOUNTER — Ambulatory Visit (INDEPENDENT_AMBULATORY_CARE_PROVIDER_SITE_OTHER): Payer: BC Managed Care – PPO | Admitting: Family Medicine

## 2019-04-13 ENCOUNTER — Encounter (INDEPENDENT_AMBULATORY_CARE_PROVIDER_SITE_OTHER): Payer: Self-pay | Admitting: Family Medicine

## 2019-04-13 VITALS — BP 106/67 | HR 65 | Temp 98.1°F | Ht 66.0 in | Wt 255.0 lb

## 2019-04-13 DIAGNOSIS — R7303 Prediabetes: Secondary | ICD-10-CM

## 2019-04-13 DIAGNOSIS — E7849 Other hyperlipidemia: Secondary | ICD-10-CM

## 2019-04-13 DIAGNOSIS — Z9189 Other specified personal risk factors, not elsewhere classified: Secondary | ICD-10-CM

## 2019-04-13 DIAGNOSIS — E66813 Obesity, class 3: Secondary | ICD-10-CM

## 2019-04-13 DIAGNOSIS — E559 Vitamin D deficiency, unspecified: Secondary | ICD-10-CM

## 2019-04-13 DIAGNOSIS — R5383 Other fatigue: Secondary | ICD-10-CM

## 2019-04-13 DIAGNOSIS — Z6841 Body Mass Index (BMI) 40.0 and over, adult: Secondary | ICD-10-CM

## 2019-04-13 MED ORDER — METFORMIN HCL 500 MG PO TABS: 500.0000 mg | ORAL_TABLET | Freq: Two times a day (BID) | ORAL | 0 refills | Status: AC

## 2019-04-13 MED ORDER — VITAMIN D (ERGOCALCIFEROL) 1.25 MG (50000 UNIT) PO CAPS: 50000.0000 [IU] | ORAL_CAPSULE | ORAL | 0 refills | Status: AC

## 2019-04-14 LAB — VITAMIN D 25 HYDROXY (VIT D DEFICIENCY, FRACTURES): Vit D, 25-Hydroxy: 55.4 ng/mL (ref 30.0–100.0)

## 2019-04-14 LAB — COMPREHENSIVE METABOLIC PANEL
ALT: 17 IU/L (ref 0–32)
AST: 19 IU/L (ref 0–40)
Albumin/Globulin Ratio: 1.9 (ref 1.2–2.2)
Albumin: 4.5 g/dL (ref 3.8–4.8)
Alkaline Phosphatase: 61 IU/L (ref 39–117)
BUN/Creatinine Ratio: 21 (ref 12–28)
BUN: 13 mg/dL (ref 8–27)
Bilirubin Total: 0.3 mg/dL (ref 0.0–1.2)
CO2: 24 mmol/L (ref 20–29)
Calcium: 9.3 mg/dL (ref 8.7–10.3)
Chloride: 102 mmol/L (ref 96–106)
Creatinine, Ser: 0.61 mg/dL (ref 0.57–1.00)
GFR calc Af Amer: 112 mL/min/{1.73_m2} (ref >59)
GFR calc non Af Amer: 97 mL/min/{1.73_m2} (ref >59)
Globulin, Total: 2.4 g/dL (ref 1.5–4.5)
Glucose: 85 mg/dL (ref 65–99)
Potassium: 4.5 mmol/L (ref 3.5–5.2)
Sodium: 140 mmol/L (ref 134–144)
Total Protein: 6.9 g/dL (ref 6.0–8.5)

## 2019-04-14 LAB — LIPID PANEL WITH LDL/HDL RATIO
Cholesterol, Total: 157 mg/dL (ref 100–199)
HDL: 52 mg/dL (ref >39)
LDL Chol Calc (NIH): 86 mg/dL (ref 0–99)
LDL/HDL Ratio: 1.7 ratio (ref 0.0–3.2)
Triglycerides: 104 mg/dL (ref 0–149)
VLDL Cholesterol Cal: 19 mg/dL (ref 5–40)

## 2019-04-14 LAB — T3: T3, Total: 109 ng/dL (ref 71–180)

## 2019-04-14 LAB — HEMOGLOBIN A1C
Est. average glucose Bld gHb Est-mCnc: 117 mg/dL
Hgb A1c MFr Bld: 5.7 % — ABNORMAL HIGH (ref 4.8–5.6)

## 2019-04-14 LAB — INSULIN, RANDOM: INSULIN: 32.5 u[IU]/mL — ABNORMAL HIGH (ref 2.6–24.9)

## 2019-04-14 LAB — TSH: TSH: 2.01 u[IU]/mL (ref 0.450–4.500)

## 2019-04-14 LAB — T4, FREE: Free T4: 1.11 ng/dL (ref 0.82–1.77)

## 2019-04-15 NOTE — Progress Notes (Signed)
Chief Complaint:   OBESITY [name] is here to discuss her progress with her obesity treatment plan along with follow-up of her obesity related diagnoses. [name] is on the Category 3 Plan and states she is following her eating plan approximately 25% of the time. [name] states she is walking for 15 minutes 3 times per week.  Today's visit was #: 15 Starting weight: 264 lbs Starting date: 04/15/2018 Today's weight: 255 lbs Today's date: 04/13/2019 Total lbs lost to date: 9 Total lbs lost since last in-office visit: 3  Interim History: [name] continues to do well with weight loss since her last visit. She still struggles with meal planning and prepping at times. Her hunger is mostly controlled.  Subjective:   1. Vitamin D deficiency [name] is stable on Vit D. Her last Vit D level was at goal. She is at risk of over-replacement now.  2. Pre-diabetes [name] continues to do very well on her diet prescription. She is due for labs.   3. Other hyperlipidemia [name] is on Crestor, and she is working on diet and exercise. She is due for labs.  4. Other fatigue [name]'s fatigue has improved. Her last thyroid level was within normal limits. She is due to have labs rechecked today.  5. At risk for heart disease [name] is at a higher than average risk for cardiovascular disease due to obesity. Reviewed: no chest pain on exertion, no dyspnea on exertion, and no swelling of ankles.  Assessment/Plan:   1. Vitamin D deficiency Low Vitamin D level contributes to fatigue and are associated with obesity, breast, and colon cancer. We will check labs today, and we will refill prescription Vit D for 1 month. She will follow-up for routine testing of vitamin D, at least 2-3 times per year to avoid over-replacement.  - Vitamin D (25 hydroxy) - Vitamin D, Ergocalciferol, (DRISDOL) 1.25 MG (50000 UT) CAPS capsule; Take 1 capsule (50,000 Units total) by mouth every 7 (seven) days.   Dispense: 4 capsule; Refill: 0  2. Pre-diabetes [name] will continue to work on weight loss, exercise, and decreasing simple carbohydrates to help decrease the risk of diabetes. We will refill metformin for 1 month, and we will check labs today.  - HgB A1c - Insulin, random - Comprehensive Metabolic Panel (CMET) - metFORMIN (GLUCOPHAGE) 500 MG tablet; Take 1 tablet (500 mg total) by mouth 2 (two) times daily with a meal.  Dispense: 60 tablet; Refill: 0  3. Other hyperlipidemia Cardiovascular risk and specific lipid/LDL goals reviewed. We discussed several lifestyle modifications today and [name] will continue to work on diet, exercise and weight loss efforts. We will check labs today. Orders and follow up as documented in patient record.   Counseling Intensive lifestyle modifications are the first line treatment for this issue. . Dietary changes: Increase soluble fiber. Decrease simple carbohydrates. . Exercise changes: Moderate to vigorous-intensity aerobic activity 150 minutes per week if tolerated. . Lipid-lowering medications: see documented in medical record.  - Lipid Panel With LDL/HDL Ratio  4. Other fatigue [name] was informed fatigue may be related to obesity, depression or many other causes. We will check thyroid today and in the meanwhile [name] has agreed to work on diet, exercise and weight loss.  - T3 - T4, free - TSH  5. At risk for heart disease [name] was given approximately 15 minutes of coronary artery disease prevention counseling today. She is 64 y.o. female and has risk factors for heart disease including obesity. We  discussed intensive lifestyle modifications today with an emphasis on specific weight loss instructions and strategies.   6. Class 3 severe obesity with serious comorbidity and body mass index (BMI) of 40.0 to 44.9 in adult, unspecified obesity type (HCC) [name] is currently in the action stage of change. As such, her goal is to continue with  weight loss efforts. She has agreed to on the Category 3 Plan.   We discussed the following exercise goals today: For substantial health benefits, adults should do at least 150 minutes (2 hours and 30 minutes) a week of moderate-intensity, or 75 minutes (1 hour and 15 minutes) a week of vigorous-intensity aerobic physical activity, or an equivalent combination of moderate- and vigorous-intensity aerobic activity. Aerobic activity should be performed in episodes of at least 10 minutes, and preferably, it should be spread throughout the week. Adults should also include muscle-strengthening activities that involve all major muscle groups on 2 or more days a week.  We discussed the following behavioral modification strategies today: meal planning and cooking strategies.  [name] has agreed to follow-up with our clinic in 2 to 3 weeks. She was informed of the importance of frequent follow-up visits to maximize her success with intensive lifestyle modifications for her multiple health conditions.   [name] was informed we would discuss her lab results at her next visit unless there is a critical issue that needs to be addressed sooner. [name] agreed to keep her next visit at the agreed upon time to discuss these results.  Objective:   Blood pressure 106/67, pulse 65, temperature 98.1 F (36.7 C), temperature source Oral, height 5\' 6"  (1.676 m), weight 255 lb (115.7 kg), SpO2 97 %. Body mass index is 41.16 kg/m.  General: Cooperative, alert, well developed, in no acute distress. HEENT: Conjunctivae and lids unremarkable. Neck: No thyromegaly.  Cardiovascular: Regular rhythm.  Lungs: Normal work of breathing. Extremities: No edema.  Neurologic: No focal deficits.   Lab Results  Component Value Date   CREATININE 0.61 04/13/2019   BUN 13 04/13/2019   NA 140 04/13/2019   K 4.5 04/13/2019   CL 102 04/13/2019   CO2 24 04/13/2019   Lab Results  Component Value Date   ALT 17 04/13/2019     AST 19 04/13/2019   ALKPHOS 61 04/13/2019   BILITOT 0.3 04/13/2019   Lab Results  Component Value Date   HGBA1C 5.7 (H) 04/13/2019   HGBA1C 5.5 10/31/2018   HGBA1C 5.7 (H) 04/15/2018   Lab Results  Component Value Date   INSULIN 32.5 (H) 04/13/2019   INSULIN 5.6 04/15/2018   Lab Results  Component Value Date   TSH 2.010 04/13/2019   Lab Results  Component Value Date   CHOL 157 04/13/2019   HDL 52 04/13/2019   LDLCALC 86 04/13/2019   TRIG 104 04/13/2019   Lab Results  Component Value Date   WBC 3.6 04/15/2018   HGB 15.6 (H) 03/17/2019   HCT 46.0 03/17/2019   MCV 85 04/15/2018   No results found for: IRON, TIBC, FERRITIN  Attestation Statements:   Reviewed by clinician on day of visit: allergies, medications, problem list, medical history, surgical history, family history, social history, and previous encounter notes.   I, Trixie Dredge, am acting as transcriptionist for Dennard Nip, MD.  I have reviewed the above documentation for accuracy and completeness, and I agree with the above. -  Dennard Nip, MD

## 2019-05-04 ENCOUNTER — Ambulatory Visit (INDEPENDENT_AMBULATORY_CARE_PROVIDER_SITE_OTHER): Payer: BC Managed Care – PPO | Admitting: Family Medicine

## 2019-05-13 ENCOUNTER — Other Ambulatory Visit (INDEPENDENT_AMBULATORY_CARE_PROVIDER_SITE_OTHER): Payer: Self-pay | Admitting: Family Medicine

## 2019-05-13 DIAGNOSIS — R7303 Prediabetes: Secondary | ICD-10-CM

## 2019-05-13 DIAGNOSIS — E559 Vitamin D deficiency, unspecified: Secondary | ICD-10-CM

## 2019-05-19 DIAGNOSIS — C541 Malignant neoplasm of endometrium: Secondary | ICD-10-CM | POA: Insufficient documentation

## 2019-11-03 ENCOUNTER — Ambulatory Visit: Payer: BC Managed Care – PPO | Admitting: Podiatry

## 2019-11-03 ENCOUNTER — Other Ambulatory Visit: Payer: Self-pay

## 2019-11-03 DIAGNOSIS — M79672 Pain in left foot: Secondary | ICD-10-CM

## 2019-11-03 DIAGNOSIS — G629 Polyneuropathy, unspecified: Secondary | ICD-10-CM | POA: Diagnosis not present

## 2019-11-03 DIAGNOSIS — M79671 Pain in right foot: Secondary | ICD-10-CM | POA: Diagnosis not present

## 2019-11-03 MED ORDER — GABAPENTIN 300 MG PO CAPS
300.0000 mg | ORAL_CAPSULE | Freq: Two times a day (BID) | ORAL | 0 refills | Status: DC
Start: 2019-11-03 — End: 2019-12-18

## 2019-11-03 NOTE — Patient Instructions (Signed)

## 2019-11-06 NOTE — Progress Notes (Signed)
Subjective:   Patient ID: [name], female   DOB: 64 y.o.   MRN: [id number]   HPI 64 year old female presents the office today for concerns of her neuropathy getting worse.  She said that she has had neuropathy but since she started chemo she states the burning and tingling to the toes has been getting worse.  She has not had any recent treatment.  She previously was on gabapentin but she states that she stopped it because it was not needed that has been sometime ago.  She denies any recent injury or falls.  Review of Systems  All other systems reviewed and are negative.  Past Medical History:  Diagnosis Date  . Asthma   . Back pain   . Hip pain    right hip cortisone injection for pain 03-10-2019  . Hyperlipidemia   . Hypertension   . Joint pain   . Knee pain   . Neuropathy    feet  . Pre-diabetes    on metformin    Past Surgical History:  Procedure Laterality Date  . colonscopy    . cyst removal     Right ankle 2016  . DILATATION & CURETTAGE/HYSTEROSCOPY WITH MYOSURE N/A 03/17/2019   Procedure: EXAMINE UNDER ANESTHESIA;  ATTEMPTENTED DIAGNOSTIC HYSTEROSCOPY;  Surgeon: Servando Salina, MD;  Location: [facility];  Service: Gynecology;  Laterality: N/A;  . fibroid removal     2001-2003     Current Outpatient Medications:  .  acetaminophen (TYLENOL) 500 MG tablet, Take 500 mg by mouth every 6 (six) hours as needed., Disp: , Rfl:  .  amLODipine-olmesartan (AZOR) 10-40 MG per tablet, Take 1 tablet by mouth at bedtime. , Disp: , Rfl:  .  amoxicillin (AMOXIL) 875 MG tablet, Take 875 mg by mouth 2 (two) times daily., Disp: , Rfl:  .  Ascorbic Acid (VITAMIN C) 100 MG tablet, Take 100 mg by mouth daily. 2 tabs daily, Disp: , Rfl:  .  b complex vitamins tablet, Take 1 tablet by mouth daily., Disp: , Rfl:  .  cephALEXin (KEFLEX) 500 MG capsule, Take 500 mg by mouth 4 (four) times daily., Disp: , Rfl:  .  cyanocobalamin 100 MCG tablet, Take by mouth., Disp: ,  Rfl:  .  ELDERBERRY PO, Take by mouth. daily, Disp: , Rfl:  .  glucose blood test strip, Use as instructed, Disp: 100 each, Rfl: 0 .  HYDROcodone-acetaminophen (NORCO/VICODIN) 5-325 MG tablet, Take 1-2 tablets by mouth every 4 (four) hours as needed., Disp: , Rfl:  .  ibuprofen (ADVIL) 600 MG tablet, Take 600 mg by mouth every 6 (six) hours as needed., Disp: , Rfl:  .  lidocaine-prilocaine (EMLA) cream, Apply topically., Disp: , Rfl:  .  loratadine (CLARITIN) 10 MG tablet, Take by mouth., Disp: , Rfl:  .  metFORMIN (GLUCOPHAGE) 500 MG tablet, Take 1 tablet (500 mg total) by mouth 2 (two) times daily with a meal., Disp: 60 tablet, Rfl: 0 .  Multiple Vitamins-Minerals (MULTIVITAMIN WITH MINERALS) tablet, Take 1 tablet by mouth daily. 2 tabs in am, Disp: , Rfl:  .  naproxen (NAPROSYN) 500 MG tablet, Take 500 mg by mouth as needed. , Disp: , Rfl:  .  ondansetron (ZOFRAN) 4 MG tablet, Take 4 mg by mouth every 8 (eight) hours as needed., Disp: , Rfl:  .  ondansetron (ZOFRAN) 8 MG tablet, Take 8 mg by mouth 3 (three) times daily., Disp: , Rfl:  .  prochlorperazine (COMPAZINE) 10 MG tablet, Take  by mouth., Disp: , Rfl:  .  rosuvastatin (CRESTOR) 20 MG tablet, Take 20 mg by mouth daily., Disp: , Rfl:  .  spironolactone (ALDACTONE) 25 MG tablet, Take 25 mg by mouth., Disp: , Rfl:  .  tiZANidine (ZANAFLEX) 4 MG tablet, Take by mouth., Disp: , Rfl:  .  traMADol (ULTRAM) 50 MG tablet, Take 50 mg by mouth 2 (two) times daily as needed., Disp: , Rfl:  .  Vitamin D, Cholecalciferol, 25 MCG (1000 UT) TABS, Take by mouth. D 3, Disp: , Rfl:  .  Vitamin D, Ergocalciferol, (DRISDOL) 1.25 MG (50000 UT) CAPS capsule, Take 1 capsule (50,000 Units total) by mouth every 7 (seven) days., Disp: 4 capsule, Rfl: 0 .  gabapentin (NEURONTIN) 300 MG capsule, Take 1 capsule (300 mg total) by mouth 2 (two) times daily., Disp: 90 capsule, Rfl: 0  No Known Allergies        Objective:  Physical Exam  General: AAO x3,  NAD  Dermatological: Scar from the prior surgery is well-healed without any callus formation there is no tenderness.  Vascular: Dorsalis Pedis artery and Posterior Tibial artery pedal pulses are 2/4 bilateral with immedate capillary fill time. There is no pain with calf compression, swelling, warmth, erythema.   Neruologic: Sensation somewhat decreased with some filament  Musculoskeletal: No area pinpoint tenderness identified at this time.  Muscular strength 5/5 in all groups tested bilateral.  Gait: Unassisted, Nonantalgic.       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  She previously been on this and tolerated it well.  She will start at nighttime and gradually increase to twice a day if needed not tolerated. -Also discussed starting a B complex vitamin -Surgical site appears to be doing well  Trula Slade DPM

## 2019-12-14 ENCOUNTER — Other Ambulatory Visit: Payer: Self-pay | Admitting: Podiatry

## 2019-12-15 ENCOUNTER — Ambulatory Visit: Payer: BC Managed Care – PPO | Admitting: Podiatry

## 2019-12-17 NOTE — Telephone Encounter (Signed)
Please advise 

## 2020-02-03 ENCOUNTER — Other Ambulatory Visit: Payer: Self-pay | Admitting: Podiatry

## 2020-02-03 NOTE — Telephone Encounter (Signed)
Error

## 2020-03-17 ENCOUNTER — Other Ambulatory Visit: Payer: Self-pay | Admitting: Podiatry

## 2020-03-20 NOTE — Telephone Encounter (Signed)
Please advise 

## 2020-06-03 ENCOUNTER — Other Ambulatory Visit: Payer: Self-pay | Admitting: Podiatry

## 2020-06-05 NOTE — Telephone Encounter (Signed)
Please advise 

## 2020-09-27 ENCOUNTER — Other Ambulatory Visit: Payer: Self-pay | Admitting: Pain Medicine

## 2020-09-27 ENCOUNTER — Ambulatory Visit
Admission: RE | Admit: 2020-09-27 | Discharge: 2020-09-27 | Disposition: A | Discharge status: Home / Self Care | Payer: BC Managed Care – PPO | Source: Ambulatory Visit | Attending: Pain Medicine | Admitting: Pain Medicine

## 2020-09-27 DIAGNOSIS — M25551 Pain in right hip: Secondary | ICD-10-CM

## 2021-11-08 ENCOUNTER — Encounter (INDEPENDENT_AMBULATORY_CARE_PROVIDER_SITE_OTHER): Payer: Self-pay

## 2021-12-06 ENCOUNTER — Other Ambulatory Visit: Payer: Self-pay | Admitting: Orthopedic Surgery

## 2021-12-06 DIAGNOSIS — G8929 Other chronic pain: Secondary | ICD-10-CM

## 2021-12-29 ENCOUNTER — Ambulatory Visit
Admission: RE | Admit: 2021-12-29 | Discharge: 2021-12-29 | Disposition: A | Discharge status: Home / Self Care | Payer: Worker's Compensation | Source: Ambulatory Visit | Attending: Orthopedic Surgery | Admitting: Orthopedic Surgery

## 2021-12-29 ENCOUNTER — Ambulatory Visit
Admission: RE | Admit: 2021-12-29 | Discharge: 2021-12-29 | Disposition: A | Discharge status: Home / Self Care | Payer: Self-pay | Source: Ambulatory Visit | Attending: Orthopedic Surgery | Admitting: Orthopedic Surgery

## 2021-12-29 DIAGNOSIS — G8929 Other chronic pain: Secondary | ICD-10-CM

## 2021-12-29 MED ORDER — METHYLPREDNISOLONE ACETATE 40 MG/ML INJ SUSP (RADIOLOG: 120.0000 mg | Freq: Once | INTRAMUSCULAR | Status: DC

## 2022-07-03 ENCOUNTER — Ambulatory Visit: Payer: BC Managed Care – PPO | Admitting: Podiatry

## 2022-07-03 ENCOUNTER — Ambulatory Visit (INDEPENDENT_AMBULATORY_CARE_PROVIDER_SITE_OTHER): Payer: BC Managed Care – PPO

## 2022-07-03 DIAGNOSIS — M2142 Flat foot [pes planus] (acquired), left foot: Secondary | ICD-10-CM | POA: Diagnosis not present

## 2022-07-03 DIAGNOSIS — M778 Other enthesopathies, not elsewhere classified: Secondary | ICD-10-CM | POA: Diagnosis not present

## 2022-07-03 DIAGNOSIS — R52 Pain, unspecified: Secondary | ICD-10-CM | POA: Diagnosis not present

## 2022-07-03 DIAGNOSIS — M7751 Other enthesopathy of right foot: Secondary | ICD-10-CM

## 2022-07-03 DIAGNOSIS — M2141 Flat foot [pes planus] (acquired), right foot: Secondary | ICD-10-CM

## 2022-07-03 MED ORDER — TRIAMCINOLONE ACETONIDE 10 MG/ML IJ SUSP: 10.0000 mg | Freq: Once | INTRAMUSCULAR | Status: AC

## 2022-07-03 MED ORDER — GABAPENTIN 300 MG PO CAPS: 300.0000 mg | ORAL_CAPSULE | Freq: Two times a day (BID) | ORAL | 0 refills | Status: DC

## 2022-07-03 NOTE — Patient Instructions (Addendum)
For shoes I like [employer], [place], [place] For inserts I like Superfeet, Powersteps, Aetrex  ---  While at your visit today you received a steroid injection in your foot or ankle to help with your pain. Along with having the steroid medication there is some "numbing" medication in the shot that you received. Due to this you may notice some numbness to the area for the next couple of hours.   I would recommend limiting activity for the next few days to help the steroid injection take affect.    The actually benefit from the steroid injection may take up to 2-7 days to see a difference. You may actually experience a small (as in 10%) INCREASE in pain in the first 24 hours---that is common. It would be best if you can ice the area today and take anti-inflammatory medications (such as Ibuprofen, Motrin, or Aleve) if you are able to take these medications. If you were prescribed another medication to help with the pain go ahead and start that medication today    Things to watch out for that you should contact us or a health care provider urgently would include: 1. Unusual (as in more than 10%) increase in pain 2. New fever > 101.5 3. New swelling or redness of the injected area.  4. Streaking of red lines around the area injected.  If you have any questions or concerns about this, please give our office a call at [phone].    ---  Plantar Fasciitis (Heel Spur Syndrome) with Rehab The plantar fascia is a fibrous, ligament-like, soft-tissue structure that spans the bottom of the foot. Plantar fasciitis is a condition that causes pain in the foot due to inflammation of the tissue. SYMPTOMS  Pain and tenderness on the underneath side of the foot. Pain that worsens with standing or walking. CAUSES  Plantar fasciitis is caused by irritation and injury to the plantar fascia on the underneath side of the foot. Common mechanisms of injury include: Direct trauma to bottom of the foot. Damage to a  small nerve that runs under the foot where the main fascia attaches to the heel bone. Stress placed on the plantar fascia due to bone spurs. RISK INCREASES WITH:  Activities that place stress on the plantar fascia (running, jumping, pivoting, or cutting). Poor strength and flexibility. Improperly fitted shoes. Tight calf muscles. Flat feet. Failure to warm-up properly before activity. Obesity. PREVENTION Warm up and stretch properly before activity. Allow for adequate recovery between workouts. Maintain physical fitness: Strength, flexibility, and endurance. Cardiovascular fitness. Maintain a health body weight. Avoid stress on the plantar fascia. Wear properly fitted shoes, including arch supports for individuals who have flat feet.  PROGNOSIS  If treated properly, then the symptoms of plantar fasciitis usually resolve without surgery. However, occasionally surgery is necessary.  RELATED COMPLICATIONS  Recurrent symptoms that may result in a chronic condition. Problems of the lower back that are caused by compensating for the injury, such as limping. Pain or weakness of the foot during push-off following surgery. Chronic inflammation, scarring, and partial or complete fascia tear, occurring more often from repeated injections.  TREATMENT  Treatment initially involves the use of ice and medication to help reduce pain and inflammation. The use of strengthening and stretching exercises may help reduce pain with activity, especially stretches of the Achilles tendon. These exercises may be performed at home or with a therapist. Your caregiver may recommend that you use heel cups of arch supports to help reduce stress on  the plantar fascia. Occasionally, corticosteroid injections are given to reduce inflammation. If symptoms persist for greater than 6 months despite non-surgical (conservative), then surgery may be recommended.   MEDICATION  If pain medication is necessary, then  nonsteroidal anti-inflammatory medications, such as aspirin and ibuprofen, or other minor pain relievers, such as acetaminophen, are often recommended. Do not take pain medication within 7 days before surgery. Prescription pain relievers may be given if deemed necessary by your caregiver. Use only as directed and only as much as you need. Corticosteroid injections may be given by your caregiver. These injections should be reserved for the most serious cases, because they may only be given a certain number of times.  HEAT AND COLD Cold treatment (icing) relieves pain and reduces inflammation. Cold treatment should be applied for 10 to 15 minutes every 2 to 3 hours for inflammation and pain and immediately after any activity that aggravates your symptoms. Use ice packs or massage the area with a piece of ice (ice massage). Heat treatment may be used prior to performing the stretching and strengthening activities prescribed by your caregiver, physical therapist, or athletic trainer. Use a heat pack or soak the injury in warm water.  SEEK IMMEDIATE MEDICAL CARE IF: Treatment seems to offer no benefit, or the condition worsens. Any medications produce adverse side effects.  EXERCISES- RANGE OF MOTION (ROM) AND STRETCHING EXERCISES - Plantar Fasciitis (Heel Spur Syndrome) These exercises may help you when beginning to rehabilitate your injury. Your symptoms may resolve with or without further involvement from your physician, physical therapist or athletic trainer. While completing these exercises, remember:  Restoring tissue flexibility helps normal motion to return to the joints. This allows healthier, less painful movement and activity. An effective stretch should be held for at least 30 seconds. A stretch should never be painful. You should only feel a gentle lengthening or release in the stretched tissue.  RANGE OF MOTION - Toe Extension, Flexion Sit with your right / left leg crossed over your  opposite knee. Grasp your toes and gently pull them back toward the top of your foot. You should feel a stretch on the bottom of your toes and/or foot. Hold this stretch for 10 seconds. Now, gently pull your toes toward the bottom of your foot. You should feel a stretch on the top of your toes and or foot. Hold this stretch for 10 seconds. Repeat  times. Complete this stretch 3 times per day.   RANGE OF MOTION - Ankle Dorsiflexion, Active Assisted Remove shoes and sit on a chair that is preferably not on a carpeted surface. Place right / left foot under knee. Extend your opposite leg for support. Keeping your heel down, slide your right / left foot back toward the chair until you feel a stretch at your ankle or calf. If you do not feel a stretch, slide your bottom forward to the edge of the chair, while still keeping your heel down. Hold this stretch for 10 seconds. Repeat 3 times. Complete this stretch 2 times per day.   STRETCH  Gastroc, Standing Place hands on wall. Extend right / left leg, keeping the front knee somewhat bent. Slightly point your toes inward on your back foot. Keeping your right / left heel on the floor and your knee straight, shift your weight toward the wall, not allowing your back to arch. You should feel a gentle stretch in the right / left calf. Hold this position for 10 seconds. Repeat 3 times. Complete this  stretch 2 times per day.  STRETCH  Soleus, Standing Place hands on wall. Extend right / left leg, keeping the other knee somewhat bent. Slightly point your toes inward on your back foot. Keep your right / left heel on the floor, bend your back knee, and slightly shift your weight over the back leg so that you feel a gentle stretch deep in your back calf. Hold this position for 10 seconds. Repeat 3 times. Complete this stretch 2 times per day.  STRETCH  Gastrocsoleus, Standing  Note: This exercise can place a lot of stress on your foot and ankle. Please  complete this exercise only if specifically instructed by your caregiver.  Place the ball of your right / left foot on a step, keeping your other foot firmly on the same step. Hold on to the wall or a rail for balance. Slowly lift your other foot, allowing your body weight to press your heel down over the edge of the step. You should feel a stretch in your right / left calf. Hold this position for 10 seconds. Repeat this exercise with a slight bend in your right / left knee. Repeat 3 times. Complete this stretch 2 times per day.   STRENGTHENING EXERCISES - Plantar Fasciitis (Heel Spur Syndrome)  These exercises may help you when beginning to rehabilitate your injury. They may resolve your symptoms with or without further involvement from your physician, physical therapist or athletic trainer. While completing these exercises, remember:  Muscles can gain both the endurance and the strength needed for everyday activities through controlled exercises. Complete these exercises as instructed by your physician, physical therapist or athletic trainer. Progress the resistance and repetitions only as guided.  STRENGTH - Towel Curls Sit in a chair positioned on a non-carpeted surface. Place your foot on a towel, keeping your heel on the floor. Pull the towel toward your heel by only curling your toes. Keep your heel on the floor. Repeat 3 times. Complete this exercise 2 times per day.  STRENGTH - Ankle Inversion Secure one end of a rubber exercise band/tubing to a fixed object (table, pole). Loop the other end around your foot just before your toes. Place your fists between your knees. This will focus your strengthening at your ankle. Slowly, pull your big toe up and in, making sure the band/tubing is positioned to resist the entire motion. Hold this position for 10 seconds. Have your muscles resist the band/tubing as it slowly pulls your foot back to the starting position. Repeat 3 times. Complete  this exercises 2 times per day.  Document Released: 03/19/2005 Document Revised: 06/11/2011 Document Reviewed: 07/01/2008 [facility] Patient Information 2014 [place], [place].

## 2022-07-03 NOTE — Progress Notes (Unsigned)
Subjective:   Patient ID: [name], female   DOB: 67 y.o.   MRN: [id number]   HPI Chief Complaint  Patient presents with   Ankle Pain    Left, pain has been ongoing for about 1 month, patient stated that when walking pain occurs    67 year old female presents the office today for concerns that she states that her RIGHT ankle just started hurting about 1 month ago.  Previously she had a cyst removed and she is not sure it is coming back.  She points more to the lateral aspect where she gets discomfort.  She does not report any injuries.  She is currently on work restrictions for her back and her knee.  She states that her foot is flat.  Review of Systems  All other systems reviewed and are negative.  Past Medical History:  Diagnosis Date   Asthma    Back pain    Hip pain    right hip cortisone injection for pain 03-10-2019   Hyperlipidemia    Hypertension    Joint pain    Knee pain    Neuropathy    feet   Pre-diabetes    on metformin    Past Surgical History:  Procedure Laterality Date   colonscopy     cyst removal     Right ankle 2016   DILATATION & CURETTAGE/HYSTEROSCOPY WITH MYOSURE N/A 03/17/2019   Procedure: EXAMINE UNDER ANESTHESIA;  ATTEMPTENTED DIAGNOSTIC HYSTEROSCOPY;  Surgeon: Servando Salina, MD;  Location: [facility];  Service: Gynecology;  Laterality: N/A;   fibroid removal     2001-2003     Current Outpatient Medications:    acetaminophen (TYLENOL) 500 MG tablet, Take 500 mg by mouth every 6 (six) hours as needed., Disp: , Rfl:    amLODipine-olmesartan (AZOR) 10-40 MG per tablet, Take 1 tablet by mouth at bedtime. , Disp: , Rfl:    amoxicillin (AMOXIL) 875 MG tablet, Take 875 mg by mouth 2 (two) times daily., Disp: , Rfl:    Ascorbic Acid (VITAMIN C) 100 MG tablet, Take 100 mg by mouth daily. 2 tabs daily, Disp: , Rfl:    b complex vitamins tablet, Take 1 tablet by mouth daily., Disp: , Rfl:    cephALEXin (KEFLEX) 500 MG capsule,  Take 500 mg by mouth 4 (four) times daily., Disp: , Rfl:    cyanocobalamin 100 MCG tablet, Take by mouth., Disp: , Rfl:    ELDERBERRY PO, Take by mouth. daily, Disp: , Rfl:    gabapentin (NEURONTIN) 300 MG capsule, Take 1 capsule (300 mg total) by mouth 2 (two) times daily., Disp: 180 capsule, Rfl: 0   glucose blood test strip, Use as instructed, Disp: 100 each, Rfl: 0   HYDROcodone-acetaminophen (NORCO/VICODIN) 5-325 MG tablet, Take 1-2 tablets by mouth every 4 (four) hours as needed., Disp: , Rfl:    ibuprofen (ADVIL) 600 MG tablet, Take 600 mg by mouth every 6 (six) hours as needed., Disp: , Rfl:    lidocaine-prilocaine (EMLA) cream, Apply topically., Disp: , Rfl:    loratadine (CLARITIN) 10 MG tablet, Take by mouth., Disp: , Rfl:    metFORMIN (GLUCOPHAGE) 500 MG tablet, Take 1 tablet (500 mg total) by mouth 2 (two) times daily with a meal., Disp: 60 tablet, Rfl: 0   Multiple Vitamins-Minerals (MULTIVITAMIN WITH MINERALS) tablet, Take 1 tablet by mouth daily. 2 tabs in am, Disp: , Rfl:    naproxen (NAPROSYN) 500 MG tablet, Take 500 mg by mouth as  needed. , Disp: , Rfl:    ondansetron (ZOFRAN) 4 MG tablet, Take 4 mg by mouth every 8 (eight) hours as needed., Disp: , Rfl:    ondansetron (ZOFRAN) 8 MG tablet, Take 8 mg by mouth 3 (three) times daily., Disp: , Rfl:    prochlorperazine (COMPAZINE) 10 MG tablet, Take by mouth., Disp: , Rfl:    rosuvastatin (CRESTOR) 20 MG tablet, Take 20 mg by mouth daily., Disp: , Rfl:    spironolactone (ALDACTONE) 25 MG tablet, Take 25 mg by mouth., Disp: , Rfl:    tiZANidine (ZANAFLEX) 4 MG tablet, Take by mouth., Disp: , Rfl:    traMADol (ULTRAM) 50 MG tablet, Take 50 mg by mouth 2 (two) times daily as needed., Disp: , Rfl:    Vitamin D, Cholecalciferol, 25 MCG (1000 UT) TABS, Take by mouth. D 3, Disp: , Rfl:    Vitamin D, Ergocalciferol, (DRISDOL) 1.25 MG (50000 UT) CAPS capsule, Take 1 capsule (50,000 Units total) by mouth every 7 (seven) days., Disp: 4  capsule, Rfl: 0  Current Facility-Administered Medications:    triamcinolone acetonide (KENALOG) 10 MG/ML injection 10 mg, 10 mg, Other, Once, Chitara Clonch, Bonna Gains, DPM  No Known Allergies        Objective:  Physical Exam  General: AAO x3, NAD  Dermatological: Skin is warm, dry and supple bilateral. There are no open sores, no preulcerative lesions, no rash or signs of infection present.  Vascular: Dorsalis Pedis artery and Posterior Tibial artery pedal pulses are 2/4 bilateral with immedate capillary fill time.  There is no pain with calf compression, swelling, warmth, erythema.   Neruologic: Grossly intact via light touch bilateral.  Musculoskeletal: Decreased medial arch height bilaterally.  Decreased range of motion of the subtalar joint on the right side.  She is majority discomfort along the sinus tarsi.  Not able to appreciate any reoccurrence of soft tissue mass at this time.  Most, on the plantar lateral aspect of the calcaneus.  No pain with lateral compression of the calcaneus.  There is no area pinpoint tenderness.  There is mild edema but there is no erythema or warmth to this area is localized.  Flexor, extensor tendons appear to be intact bilaterally.  MMT 5/5.  Gait: Unassisted, Nonantalgic.       Assessment:   67 year old female with capsulitis right ankle; flatfoot     Plan:  -Treatment options discussed including all alternatives, risks, and complications -Etiology of symptoms were discussed -X-rays were obtained and reviewed with the patient.  3 views of the ankle were obtained.  No evidence of acute fracture.  Negative calcaneal inclination well.  Ankle joint maintained. -We discussed steroid injection she wishes to proceed.  Steroid injection was performed today. I cleaned skin with Betadine, alcohol mixture 1 cc Kenalog 10, 0.5 cc of Marcaine plain, 0.5 cc of lidocaine plain was infiltrated into the sinus tarsi without complications.  Postinjection care  discussed.  Tolerated well. -We discussed stretching, icing on regular basis.  Discussed using good arch support.  Discussed custom inserts versus over-the-counter.  She will be changing shoes as well as over-the-counter inserts to start.  Trula Slade DPM

## 2022-08-28 ENCOUNTER — Emergency Department (HOSPITAL_BASED_OUTPATIENT_CLINIC_OR_DEPARTMENT_OTHER)
Admission: EM | Admit: 2022-08-28 | Discharge: 2022-08-28 | Disposition: A | Discharge status: Home / Self Care | Payer: Worker's Compensation | Attending: Emergency Medicine | Admitting: Emergency Medicine

## 2022-08-28 ENCOUNTER — Other Ambulatory Visit: Payer: Self-pay

## 2022-08-28 ENCOUNTER — Emergency Department (HOSPITAL_BASED_OUTPATIENT_CLINIC_OR_DEPARTMENT_OTHER): Payer: BC Managed Care – PPO

## 2022-08-28 ENCOUNTER — Encounter (HOSPITAL_BASED_OUTPATIENT_CLINIC_OR_DEPARTMENT_OTHER): Payer: Self-pay | Admitting: Emergency Medicine

## 2022-08-28 DIAGNOSIS — W19XXXA Unspecified fall, initial encounter: Secondary | ICD-10-CM

## 2022-08-28 DIAGNOSIS — S0003XA Contusion of scalp, initial encounter: Secondary | ICD-10-CM | POA: Insufficient documentation

## 2022-08-28 DIAGNOSIS — W08XXXA Fall from other furniture, initial encounter: Secondary | ICD-10-CM | POA: Diagnosis not present

## 2022-08-28 DIAGNOSIS — S7001XA Contusion of right hip, initial encounter: Secondary | ICD-10-CM | POA: Insufficient documentation

## 2022-08-28 DIAGNOSIS — R519 Headache, unspecified: Secondary | ICD-10-CM | POA: Diagnosis present

## 2022-08-28 MED ORDER — HYDROCODONE-ACETAMINOPHEN 5-325 MG PO TABS
2.0000 | ORAL_TABLET | Freq: Once | ORAL | Status: AC
  Administered 2022-08-28: 2 via ORAL
  Filled 2022-08-28: qty 2

## 2022-08-28 MED ORDER — HYDROCODONE-ACETAMINOPHEN 5-325 MG PO TABS: 1.0000 | ORAL_TABLET | ORAL | 0 refills | Status: AC | PRN

## 2022-08-28 NOTE — ED Triage Notes (Signed)
Patient arrives in wheelchair by POV c/o pain to back of head and buttocks/ lower back after a fall at work. Patient was standing, leaned against a table that collapsed causing her to fall directly onto her bottom.

## 2022-08-28 NOTE — ED Provider Notes (Signed)
[facility] Provider Note   CSN: [id number] Arrival date & time: 08/28/22  1401     History  Chief Complaint  Patient presents with   Marletta Lor    [name] is a 67 y.o. female.  [name] is a 67 year old african [place] female who presents to the ED with chief complaint of back and head pain x 1 day. This morning around 7am, she was leaning on a folding table at her job when the table collapsed and she fell backwards. The patient landed directly on her buttocks and also hit the back of her head. After this, she experienced nausea, dizziness, and had intermittent visual blurring. Her back pain is rated 8/10 and radiates down the back of her leg on the right side. The patient has had a constant headache since the fall occurred. She has not taken any OTC pain medications.    Fall Pertinent negatives include no chest pain and no abdominal pain. Nothing relieves the symptoms.       Home Medications Prior to Admission medications   Medication Sig Start Date End Date Taking? Authorizing Provider  gabapentin (NEURONTIN) 300 MG capsule Take 1 capsule (300 mg total) by mouth 2 (two) times daily. 07/03/22  Yes Vivi Barrack, DPM  acetaminophen (TYLENOL) 500 MG tablet Take 500 mg by mouth every 6 (six) hours as needed.    [provider]  amLODipine-olmesartan (AZOR) 10-40 MG per tablet Take 1 tablet by mouth at bedtime.     [provider]  amoxicillin (AMOXIL) 875 MG tablet Take 875 mg by mouth 2 (two) times daily. 08/22/19   [provider]  Ascorbic Acid (VITAMIN C) 100 MG tablet Take 100 mg by mouth daily. 2 tabs daily    [provider]  b complex vitamins tablet Take 1 tablet by mouth daily.    [provider]  cephALEXin (KEFLEX) 500 MG capsule Take 500 mg by mouth 4 (four) times daily. 05/22/19   [provider]  cyanocobalamin 100 MCG tablet Take by mouth.    [provider]  ELDERBERRY PO Take by mouth. daily    [provider]  glucose blood test strip Use as instructed 04/29/18   Quillian Quince D, MD  HYDROcodone-acetaminophen (NORCO/VICODIN) 5-325 MG tablet Take 1-2 tablets by mouth every 4 (four) hours as needed. 08/19/19   [provider]  ibuprofen (ADVIL) 600 MG tablet Take 600 mg by mouth every 6 (six) hours as needed. 08/22/19   [provider]  lidocaine-prilocaine (EMLA) cream Apply topically. 08/24/19   [provider]  loratadine (CLARITIN) 10 MG tablet Take by mouth. 09/21/19   [provider]  metFORMIN (GLUCOPHAGE) 500 MG tablet Take 1 tablet (500 mg total) by mouth 2 (two) times daily with a meal. 04/13/19   Quillian Quince D, MD  Multiple Vitamins-Minerals (MULTIVITAMIN WITH MINERALS) tablet Take 1 tablet by mouth daily. 2 tabs in am    [provider]  naproxen (NAPROSYN) 500 MG tablet Take 500 mg by mouth as needed.  02/16/15   [provider]  ondansetron (ZOFRAN) 4 MG tablet Take 4 mg by mouth every 8 (eight) hours as needed. 05/15/19   [provider]  ondansetron (ZOFRAN) 8 MG tablet Take 8 mg by mouth 3 (three) times daily. 10/15/19   [provider]  prochlorperazine (COMPAZINE) 10 MG tablet Take by mouth. 08/24/19   [provider]  rosuvastatin (CRESTOR)  20 MG tablet Take 20 mg by mouth daily.    [provider]  spironolactone (ALDACTONE) 25 MG tablet Take 25 mg by mouth.    [provider]  tiZANidine (ZANAFLEX) 4 MG tablet Take by mouth. 07/14/19   [provider]  traMADol (ULTRAM) 50 MG tablet Take 50 mg by mouth 2 (two) times daily as needed. 08/29/19   [provider]  Vitamin D, Cholecalciferol, 25 MCG (1000 UT) TABS Take by mouth. D 3    [provider]  Vitamin D, Ergocalciferol, (DRISDOL) 1.25 MG (50000 UT) CAPS capsule Take 1 capsule (50,000 Units total) by mouth every 7 (seven) days. 04/13/19   Wilder Glade, MD      Allergies    Patient has no known allergies.    Review of Systems   Review of Systems  Cardiovascular:  Negative for chest pain.  Gastrointestinal:  Negative for abdominal pain.  All other systems reviewed and are negative.   Physical Exam Updated Vital Signs BP 132/75   Pulse 70   Temp 98.1 F (36.7 C) (Oral)   Resp 20   Ht 5\' 6"  (1.676 m)   Wt 115.7 kg   SpO2 99%   BMI 41.16 kg/m  Physical Exam Vitals and nursing note reviewed.  Constitutional:      Appearance: Normal appearance. She is well-developed.  HENT:     Head: Normocephalic.  Eyes:     Pupils: Pupils are equal, round, and reactive to light.  Cardiovascular:     Rate and Rhythm: Normal rate.  Pulmonary:     Effort: Pulmonary effort is normal.  Abdominal:     General: There is no distension.  Musculoskeletal:     Cervical back: Normal range of motion and neck supple.     Comments: Tender right hip,  pain with straight leg raise,  nv and ns intact    Skin:    General: Skin is warm.  Neurological:     General: No focal deficit present.     Mental Status: She is alert and oriented to person, place, and time.  Psychiatric:        Mood and Affect: Mood normal.     ED Results / Procedures / Treatments   Labs (all labs ordered are listed, but only abnormal results are displayed) Labs Reviewed - No data to display  EKG None  Radiology No results found.  Procedures Procedures    Medications Ordered in ED Medications  HYDROcodone-acetaminophen (NORCO/VICODIN) 5-325 MG per tablet 2 tablet (2 tablets Oral Given 08/28/22 1645)    ED Course/ Medical Decision Making/ A&P Clinical Course as of 08/28/22 1738  Tue Aug 28, 2022  1703 DG Hip Alvera Novel W or Wo Pelvis 2-3 Views Right [JS]    Clinical Course User Index [JS] Normajean Baxter, [place]                             Medical Decision Making Pt was leaning on a table that collapsed.  Pt his her head and her right  hip  Amount and/or Complexity of Data Reviewed Radiology: ordered and independent interpretation performed. Decision-making details documented in ED Course.    Details: Ct head  no acute abnormality Xray right hip  no fracture   Risk Prescription drug management.           Final Clinical Impression(s) / ED Diagnoses Final diagnoses:  Fall, initial encounter  Contusion  of scalp, initial encounter  Contusion of right hip, initial encounter    Rx / DC Orders ED Discharge Orders     None     An After Visit Summary was printed and given to the patient.     Osie Cheeks 08/28/22 Leron Croak, MD 08/28/22 2139

## 2022-08-28 NOTE — Discharge Instructions (Addendum)
Return if any problems. See your Physician for recheck if symptoms persist  

## 2022-08-28 NOTE — ED Notes (Signed)
Reviewed discharge instructions and medications with pt. Pt states understanding. Pt wheeled out to car . Accompanied by husband

## 2022-09-26 ENCOUNTER — Other Ambulatory Visit: Payer: Self-pay | Admitting: Podiatry

## 2023-01-10 ENCOUNTER — Ambulatory Visit: Payer: BC Managed Care – PPO | Admitting: Podiatry

## 2023-01-10 DIAGNOSIS — Z79899 Other long term (current) drug therapy: Secondary | ICD-10-CM

## 2023-01-10 DIAGNOSIS — B353 Tinea pedis: Secondary | ICD-10-CM | POA: Diagnosis not present

## 2023-01-10 DIAGNOSIS — B351 Tinea unguium: Secondary | ICD-10-CM

## 2023-01-10 DIAGNOSIS — L853 Xerosis cutis: Secondary | ICD-10-CM

## 2023-01-10 MED ORDER — AMMONIUM LACTATE 12 % EX CREA: 1.0000 | TOPICAL_CREAM | CUTANEOUS | 0 refills | Status: DC | PRN

## 2023-01-10 NOTE — Patient Instructions (Signed)
Terbinafine Tablets What is this medication? TERBINAFINE (TER bin a feen) treats fungal infections of the nails. It belongs to a group of medications called antifungals. It will not treat infections caused by bacteria or viruses. This medicine may be used for other purposes; ask your health care provider or pharmacist if you have questions. COMMON BRAND NAME(S): Lamisil, Terbinex What should I tell my care team before I take this medication? They need to know if you have any of these conditions: Liver disease An unusual or allergic reaction to terbinafine, other medications, foods, dyes, or preservatives Pregnant or trying to get pregnant Breast-feeding How should I use this medication? Take this medication by mouth with water. Take it as directed on the prescription label at the same time every day. You can take it with or without food. If it upsets your stomach, take it with food. Keep taking it unless your care team tells you to stop. A special MedGuide will be given to you by the pharmacist with each prescription and refill. Be sure to read this information carefully each time. Talk to your care team regarding the use of this medication in children. Special care may be needed. Overdosage: If you think you have taken too much of this medicine contact a poison control center or emergency room at once. NOTE: This medicine is only for you. Do not share this medicine with others. What if I miss a dose? If you miss a dose, take it as soon as you can unless it is more than 4 hours late. If it is more than 4 hours late, skip the missed dose. Take the next dose at the normal time. What may interact with this medication? Do not take this medication with any of the following: Pimozide Thioridazine This medication may also interact with the following: Beta blockers Caffeine Certain medications for mental health conditions Cimetidine Cyclosporine Medications for fungal infections like fluconazole  and ketoconazole Medications for irregular heartbeat like amiodarone, flecainide and propafenone Rifampin Warfarin This list may not describe all possible interactions. Give your health care provider a list of all the medicines, herbs, non-prescription drugs, or dietary supplements you use. Also tell them if you smoke, drink alcohol, or use illegal drugs. Some items may interact with your medicine. What should I watch for while using this medication? Visit your care team for regular checks on your progress. You may need blood work while you are taking this medication. It may be some time before you see the benefit from this medication. This medication may cause serious skin reactions. They can happen weeks to months after starting the medication. Contact your care team right away if you notice fevers or flu-like symptoms with a rash. The rash may be red or purple and then turn into blisters or peeling of the skin. Or, you might notice a red rash with swelling of the face, lips or lymph nodes in your neck or under your arms. This medication can make you more sensitive to the sun. Keep out of the sun, If you cannot avoid being in the sun, wear protective clothing and sunscreen. Do not use sun lamps or tanning beds/booths. What side effects may I notice from receiving this medication? Side effects that you should report to your care team as soon as possible: Allergic reactions--skin rash, itching, hives, swelling of the face, lips, tongue, or throat Change in sense of smell Change in taste Infection--fever, chills, cough, or sore throat Liver injury--right upper belly pain, loss of appetite, nausea,   light-colored stool, dark yellow or brown urine, yellowing skin or eyes, unusual weakness or fatigue Low red blood cell level--unusual weakness or fatigue, dizziness, headache, trouble breathing Lupus-like syndrome--joint pain, swelling, or stiffness, butterfly-shaped rash on the face, rashes that get worse  in the sun, fever, unusual weakness or fatigue Rash, fever, and swollen lymph nodes Redness, blistering, peeling, or loosening of the skin, including inside the mouth Unusual bruising or bleeding Worsening mood, feelings of depression Side effects that usually do not require medical attention (report to your care team if they continue or are bothersome): Diarrhea Gas Headache Nausea Stomach pain Upset stomach This list may not describe all possible side effects. Call your doctor for medical advice about side effects. You may report side effects to FDA at 1-800-FDA-1088. Where should I keep my medication? Keep out of the reach of children and pets. Store between 20 and 25 degrees C (68 and 77 degrees F). Protect from light. Get rid of any unused medication after the expiration date. To get rid of medications that are no longer needed or have expired: Take the medication to a medication take-back program. Check with your pharmacy or law enforcement to find a location. If you cannot return the medication, check the label or package insert to see if the medication should be thrown out in the garbage or flushed down the toilet. If you are not sure, ask your care team. If it is safe to put it in the trash, take the medication out of the container. Mix the medication with cat litter, dirt, coffee grounds, or other unwanted substance. Seal the mixture in a bag or container. Put it in the trash. NOTE: This sheet is a summary. It may not cover all possible information. If you have questions about this medicine, talk to your doctor, pharmacist, or health care provider.  2024 Elsevier/Gold Standard (2020-11-02 00:00:00)  

## 2023-01-10 NOTE — Progress Notes (Signed)
Subjective: 67 year old female presents office with concerns of peeling, dry skin as well as her nails becoming thick and discolored.  Denies any drainage or pus or any open lesions.  She denies any recent treatment for this issue.  She has no other concerns today.  Review of Systems  All other systems reviewed and are negative.   Objective: AAO x3, NAD DP/PT pulses palpable bilaterally, CRT less than 3 seconds There is dry, peeling, erythematous skin but this is of the tinea infection to the nails are hypertrophic, dystrophic (elaboration.  No edema, erythema or signs of infection.  There is no open lesions. No pain with calf compression, swelling, warmth, erythema  Assessment: Dry skin, tinea pedis, onychomycosis  Plan: -All treatment options discussed with the patient including all alternatives, risks, complications.  -We discussed.  Treatments for this including oral, topical as well as alternative treatments.  After discussion she wants to proceed with oral medication we discussed Lamisil.  Discussed side effects.  Will check a CBC and LFT prior to starting the medication. -Patient encouraged to call the office with any questions, concerns, change in symptoms.   Vivi Barrack DPM

## 2023-01-11 ENCOUNTER — Other Ambulatory Visit: Payer: Self-pay | Admitting: Podiatry

## 2023-01-11 DIAGNOSIS — Z79899 Other long term (current) drug therapy: Secondary | ICD-10-CM

## 2023-01-11 LAB — CBC WITH DIFFERENTIAL/PLATELET
Basophils Absolute: 0 10*3/uL (ref 0.0–0.2)
Basos: 0 %
EOS (ABSOLUTE): 0 10*3/uL (ref 0.0–0.4)
Eos: 1 %
Hematocrit: 39.2 % (ref 34.0–46.6)
Hemoglobin: 12.8 g/dL (ref 11.1–15.9)
Immature Grans (Abs): 0 10*3/uL (ref 0.0–0.1)
Immature Granulocytes: 0 %
Lymphocytes Absolute: 2 10*3/uL (ref 0.7–3.1)
Lymphs: 47 %
MCH: 29.7 pg (ref 26.6–33.0)
MCHC: 32.7 g/dL (ref 31.5–35.7)
MCV: 91 fL (ref 79–97)
Monocytes Absolute: 0.5 10*3/uL (ref 0.1–0.9)
Monocytes: 12 %
Neutrophils Absolute: 1.8 10*3/uL (ref 1.4–7.0)
Neutrophils: 40 %
Platelets: 168 10*3/uL (ref 150–450)
RBC: 4.31 x10E6/uL (ref 3.77–5.28)
RDW: 13.4 % (ref 11.7–15.4)
WBC: 4.4 10*3/uL (ref 3.4–10.8)

## 2023-01-11 LAB — HEPATIC FUNCTION PANEL
ALT: 24 [IU]/L (ref 0–32)
AST: 28 [IU]/L (ref 0–40)
Albumin: 4.3 g/dL (ref 3.9–4.9)
Alkaline Phosphatase: 81 [IU]/L (ref 44–121)
Bilirubin Total: 0.3 mg/dL (ref 0.0–1.2)
Bilirubin, Direct: 0.12 mg/dL (ref 0.00–0.40)
Total Protein: 6.6 g/dL (ref 6.0–8.5)

## 2023-01-11 MED ORDER — TERBINAFINE HCL 250 MG PO TABS: 250.0000 mg | ORAL_TABLET | Freq: Every day | ORAL | 0 refills | Status: AC

## 2023-04-09 ENCOUNTER — Other Ambulatory Visit: Payer: Self-pay | Admitting: Podiatry

## 2023-04-09 ENCOUNTER — Telehealth: Payer: Self-pay

## 2023-04-09 DIAGNOSIS — Z79899 Other long term (current) drug therapy: Secondary | ICD-10-CM

## 2023-04-09 NOTE — Telephone Encounter (Signed)
 Patient called and left message inquiring about appointment on 04/15/23. Patient states that she is scheduled to come in on 04/05/23,but she has recently had back surgery and unable to walk a lot. She states that she has a post-op visit with her doctor on Wednesday and she would like to know if she can have the blood work done while she is out on Wednesday and if her appointment can be virtual to discuss lab results.

## 2023-04-10 NOTE — Telephone Encounter (Signed)
 I spoke with patient and she verblaized her understanding.

## 2023-04-13 LAB — CBC WITH DIFFERENTIAL/PLATELET
Basophils Absolute: 0 10*3/uL (ref 0.0–0.2)
Basos: 0 %
EOS (ABSOLUTE): 0 10*3/uL (ref 0.0–0.4)
Eos: 1 %
Hematocrit: 37.6 % (ref 34.0–46.6)
Hemoglobin: 12.3 g/dL (ref 11.1–15.9)
Immature Grans (Abs): 0 10*3/uL (ref 0.0–0.1)
Immature Granulocytes: 0 %
Lymphocytes Absolute: 1.5 10*3/uL (ref 0.7–3.1)
Lymphs: 40 %
MCH: 29 pg (ref 26.6–33.0)
MCHC: 32.7 g/dL (ref 31.5–35.7)
MCV: 89 fL (ref 79–97)
Monocytes Absolute: 0.4 10*3/uL (ref 0.1–0.9)
Monocytes: 11 %
Neutrophils Absolute: 1.7 10*3/uL (ref 1.4–7.0)
Neutrophils: 48 %
Platelets: 164 10*3/uL (ref 150–450)
RBC: 4.24 x10E6/uL (ref 3.77–5.28)
RDW: 11.9 % (ref 11.7–15.4)
WBC: 3.7 10*3/uL (ref 3.4–10.8)

## 2023-04-13 LAB — HEPATIC FUNCTION PANEL
ALT: 10 [IU]/L (ref 0–32)
AST: 16 [IU]/L (ref 0–40)
Albumin: 4.1 g/dL (ref 3.9–4.9)
Alkaline Phosphatase: 121 [IU]/L (ref 44–121)
Bilirubin Total: 0.3 mg/dL (ref 0.0–1.2)
Bilirubin, Direct: 0.11 mg/dL (ref 0.00–0.40)
Total Protein: 6.3 g/dL (ref 6.0–8.5)

## 2023-04-15 ENCOUNTER — Ambulatory Visit: Payer: 59 | Admitting: Podiatry

## 2023-04-15 ENCOUNTER — Other Ambulatory Visit: Payer: Self-pay | Admitting: Podiatry

## 2023-04-15 ENCOUNTER — Telehealth: Payer: Self-pay | Admitting: Podiatry

## 2023-04-15 DIAGNOSIS — B351 Tinea unguium: Secondary | ICD-10-CM

## 2023-04-15 MED ORDER — EFINACONAZOLE 10 % EX SOLN: 1.0000 [drp] | Freq: Every day | CUTANEOUS | 11 refills | Status: AC

## 2023-04-15 NOTE — Progress Notes (Signed)
 Subjective:  I connected with  [name] on 04/21/23 by a video enabled telemedicine application and verified that I am speaking with the correct person using two identifiers.   I discussed the limitations of evaluation and management by telemedicine. The patient expressed understanding and agreed to proceed.  She said that she has been doing better the nails are growing out.  She recently had surgery she has not been able to ambulate well so she went to do a virtual visit today.  She is not having any side effects or any issues with the medication that she was on.   Objective: AAO x3, NAD DP/PT pulses palpable bilaterally, CRT less than 3 seconds Patient states that the nails are growing out and overall she is doing better.  There is no pain in the nails reports no swelling redness or drainage. No pain with calf compression, swelling, warmth, erythema  Assessment: Onychomycosis  Plan: -All treatment options discussed with the patient including all alternatives, risks, complications.  -We reviewed her previous blood work.  Follow-up within normal limits she does not want to proceed with further oral medication.  Will continue with topical occasions to help nails continue grow out.  Prescribed Jublia .  If not able to this, likely prescribed Penlac. -Patient encouraged to call the office with any questions, concerns, change in symptoms.   [name] DPM

## 2023-04-15 NOTE — Telephone Encounter (Signed)
 Pt called and was scheduled at 215 today originally but was told it was a virtual visit, she has been waiting by the phone. I did explain that most of the virtual visits are after clinic. She is wanting to make sure you are going to call her today.please advise?

## 2023-05-13 ENCOUNTER — Other Ambulatory Visit: Payer: Self-pay | Admitting: Podiatry

## 2023-06-02 IMAGING — DX DG HIP (WITH OR WITHOUT PELVIS) 2-3V*R*
2 series · 2 of 2 positions shown · non-contrast
Comparison: None.

CLINICAL DATA: Pain

EXAM:
DG HIP  2-3V RIGHT

[dg hip unilat w or w/o pelvis 2-3 views  (1 of 2)]
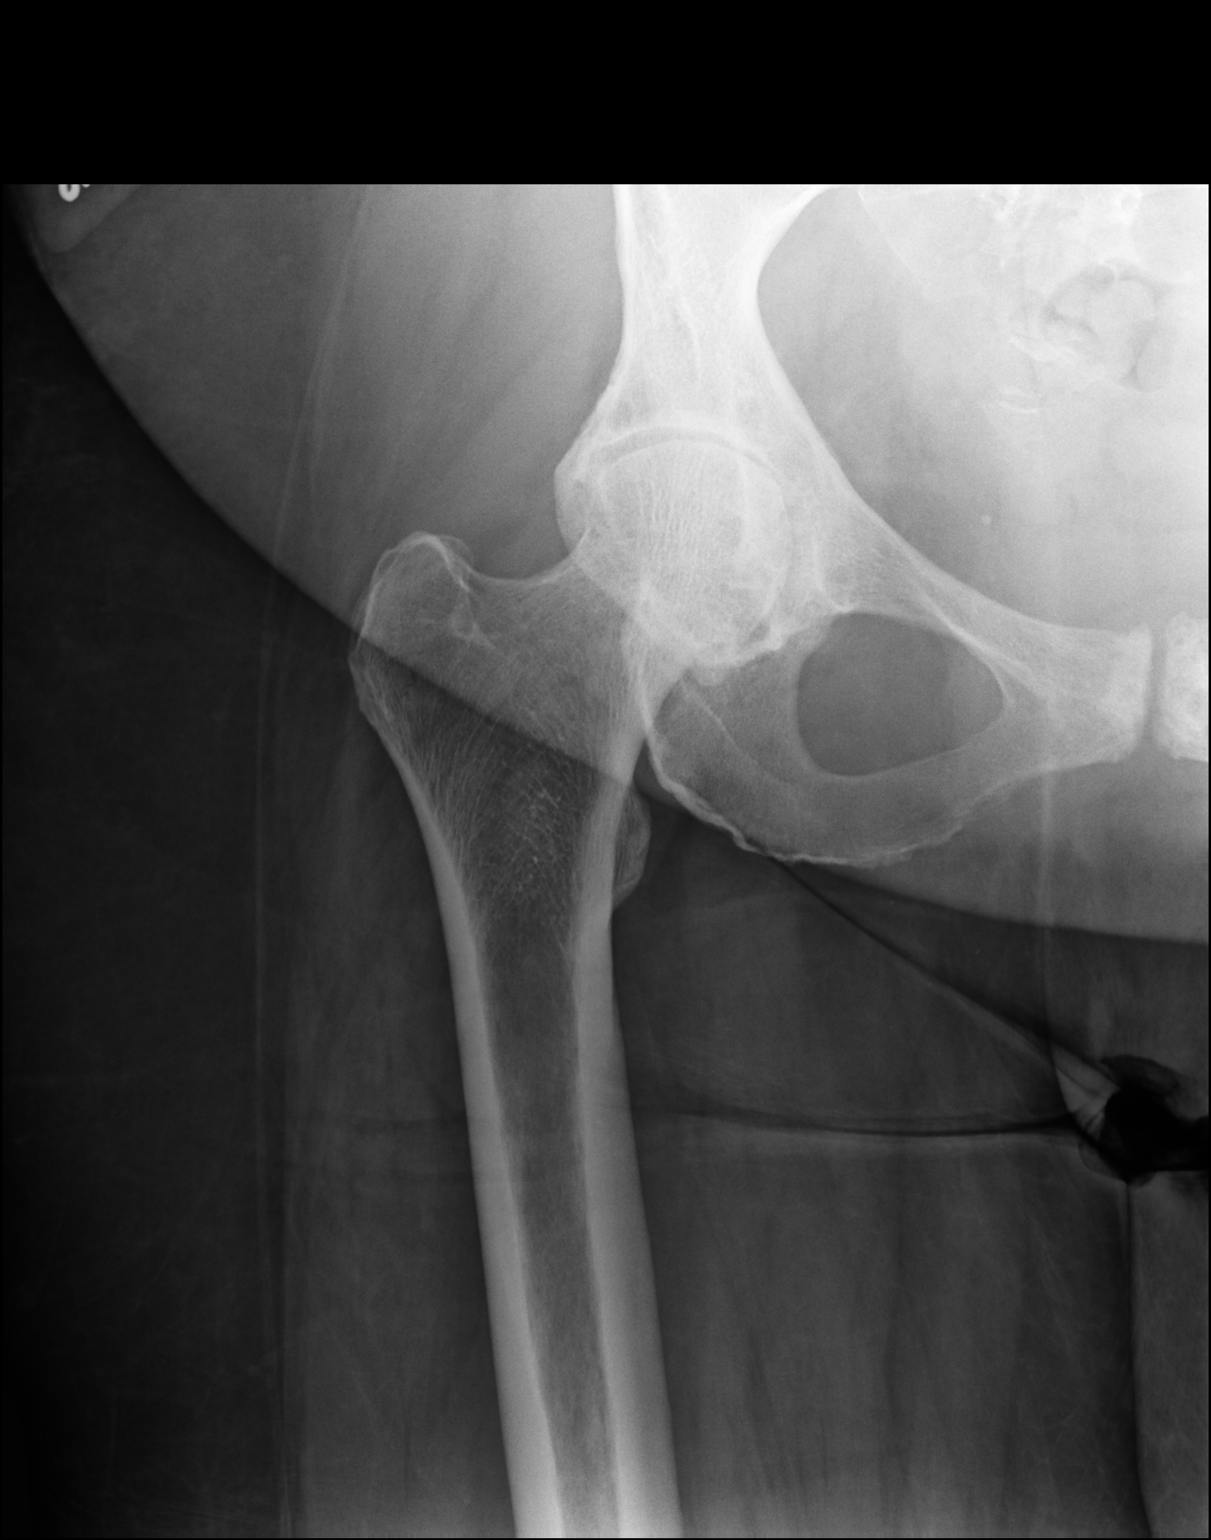

[dg hip unilat w or w/o pelvis 2-3 views  (2 of 2)]
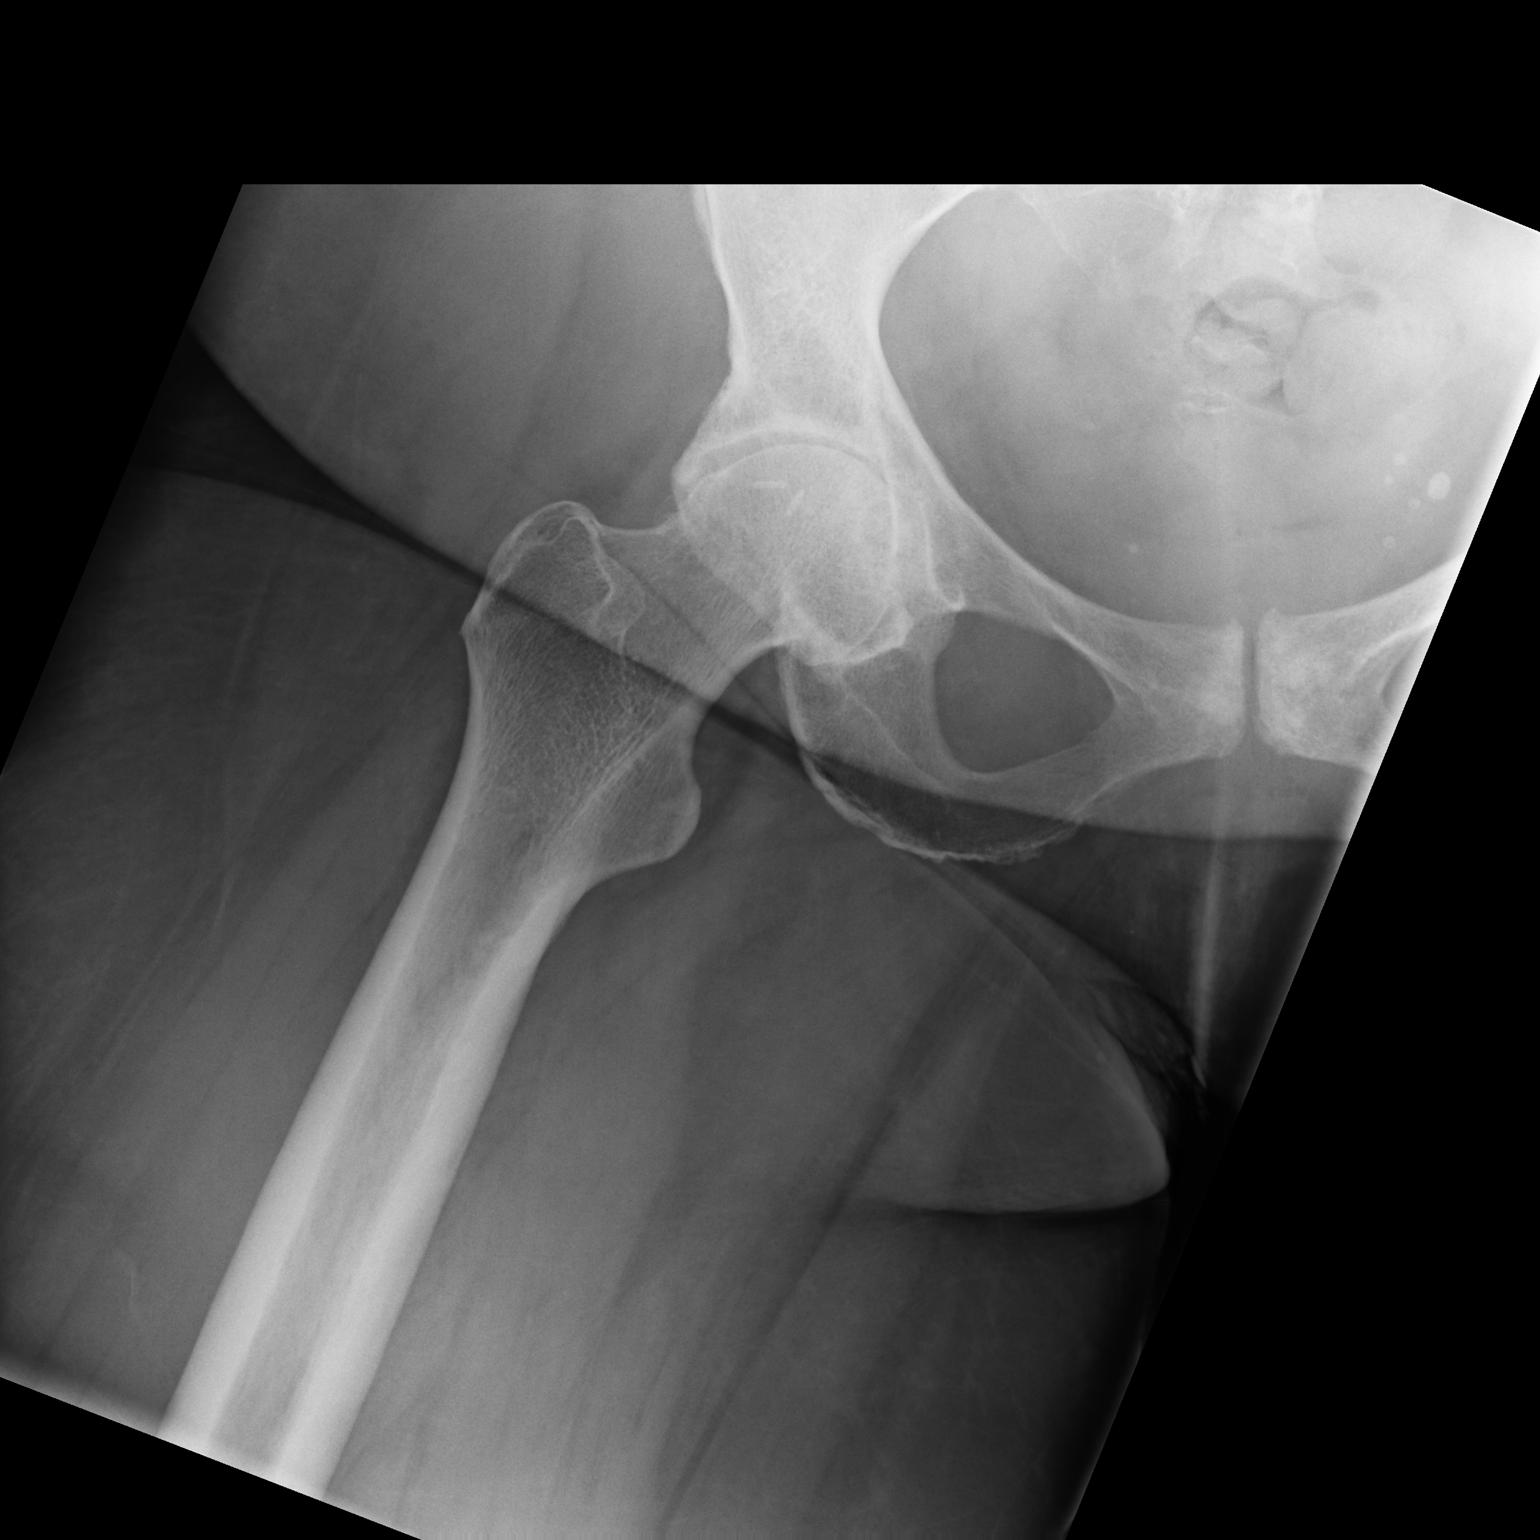

[2 of 2 positions shown; findings below may reference images not displayed]

FINDINGS: Frontal and lateral views obtained. No appreciable fracture or
dislocation. There is moderately severe narrowing of the right hip
joint. No erosive change. Visualized right sacroiliac joint
unremarkable.
IMPRESSION: Moderately severe narrowing right hip joint. No fracture or
dislocation.
